# Patient Record
Sex: Female | Born: 1995 | Race: White | Hispanic: No | Marital: Married
Health system: Midwestern US, Academic
[De-identification: ages and names within clinical notes are randomized; demographics above are authoritative.]

## PROBLEM LIST (undated history)

## (undated) MED FILL — NIFEDIPINE 10 MG CAPSULE: 10 10 MG | ORAL | 30 days supply | Qty: 120 | Fill #0

---

## 2018-09-29 IMAGING — US US OB TRANSVAGINAL
1 series · 14 of 26 positions shown · non-contrast
Comparison: none

[Series 1: us ob transvaginal · 14 of 26 slices shown]
[im 1/26]
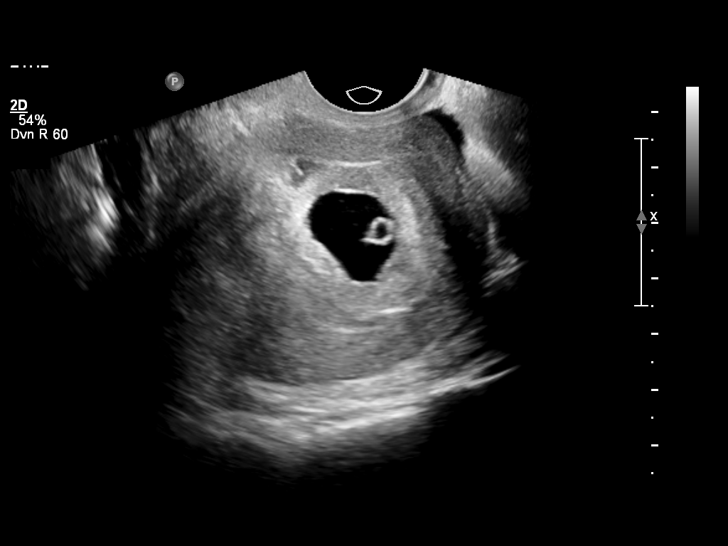
[im 3/26]
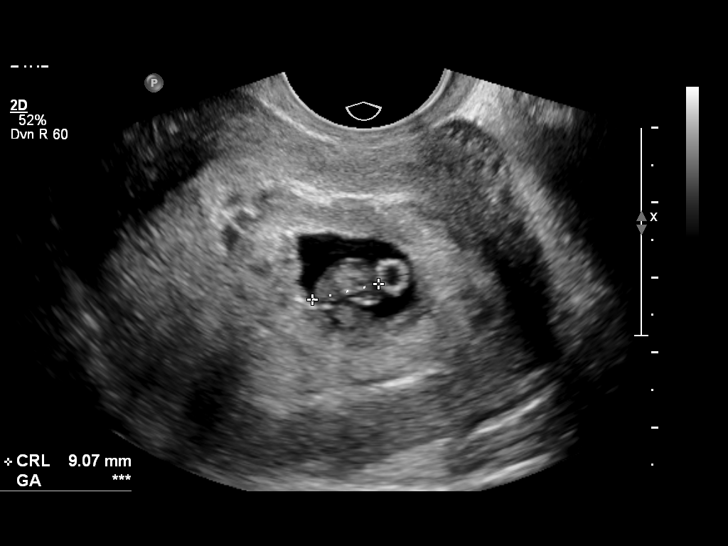
[im 5/26]
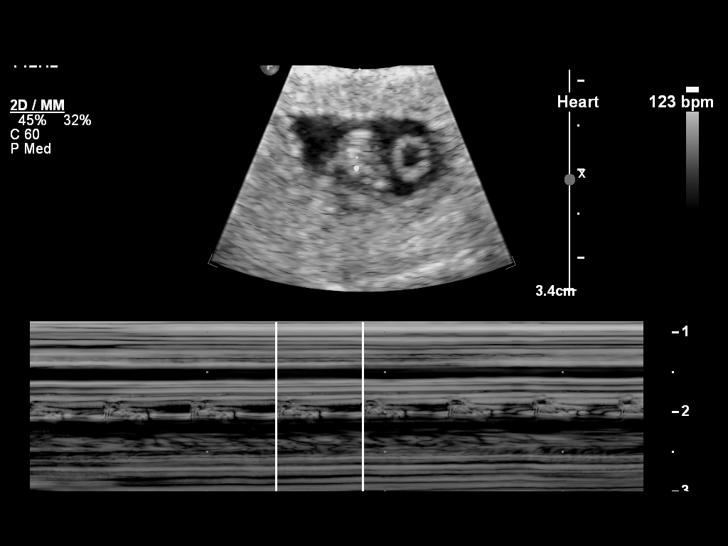
[im 7/26]
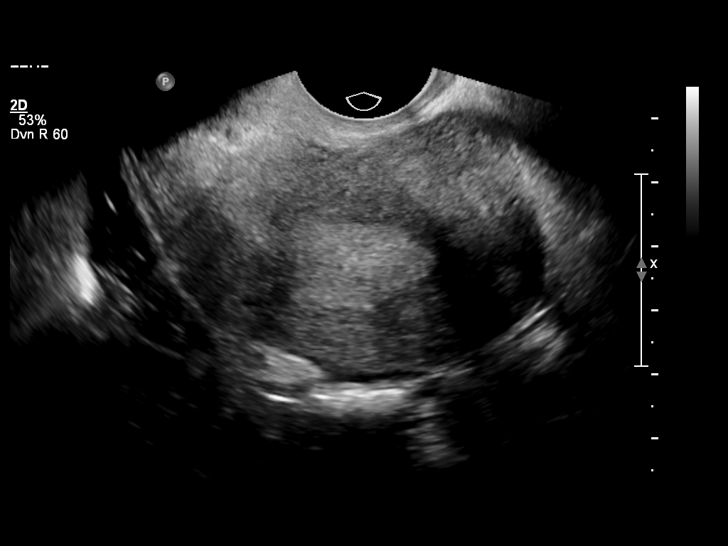
[im 9/26]
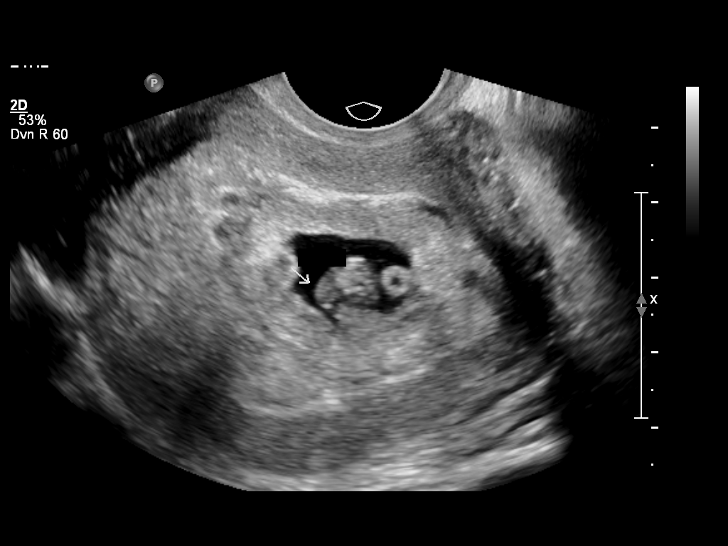
[im 11/26]
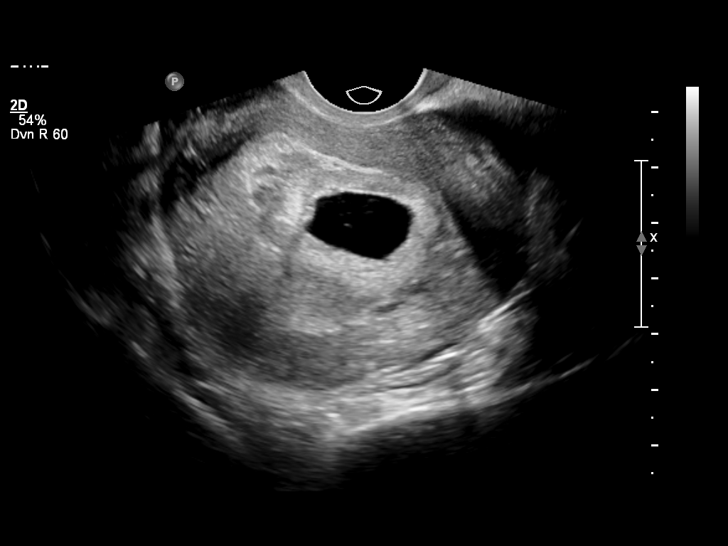
[im 13/26]
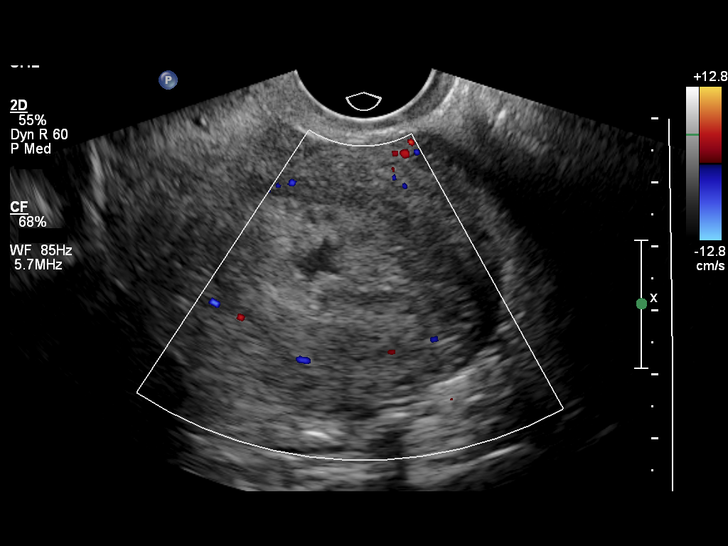
[im 14/26]
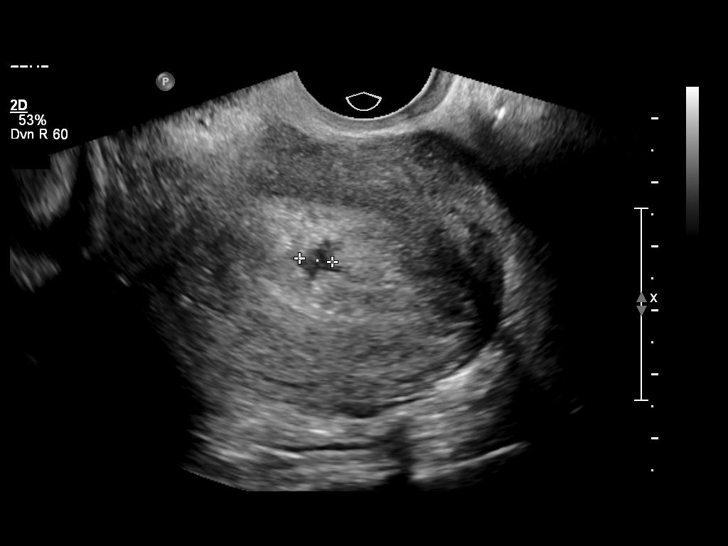
[im 16/26]
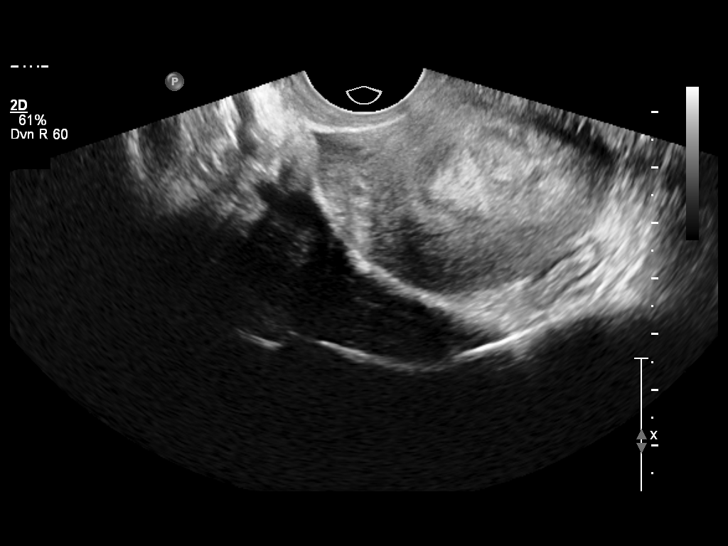
[im 18/26]
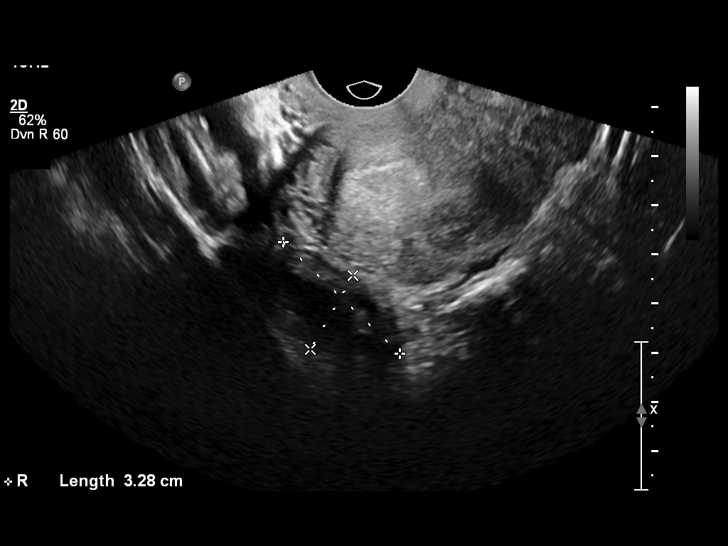
[im 20/26]
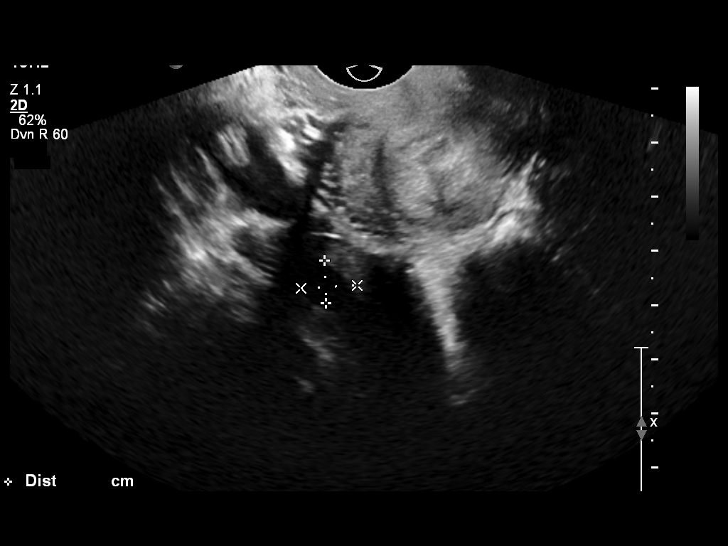
[im 22/26]
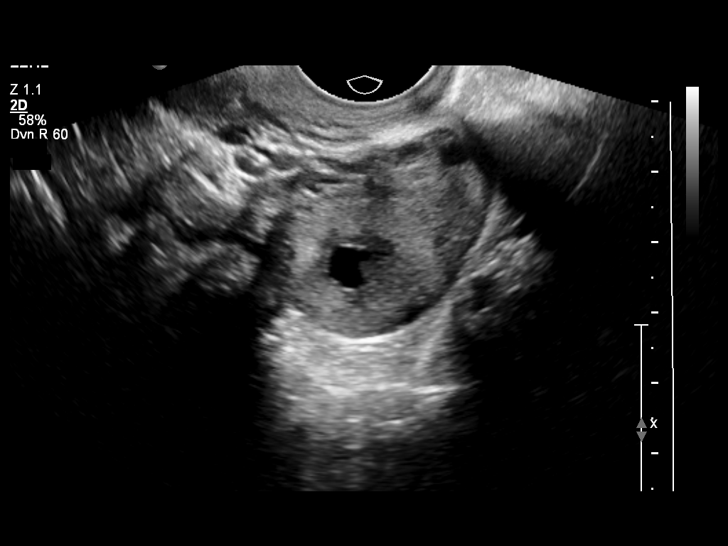
[im 24/26]
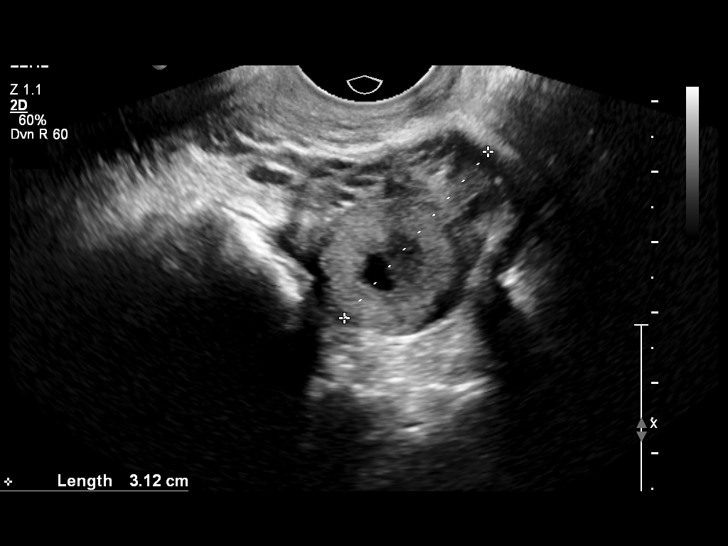
[im 26/26]
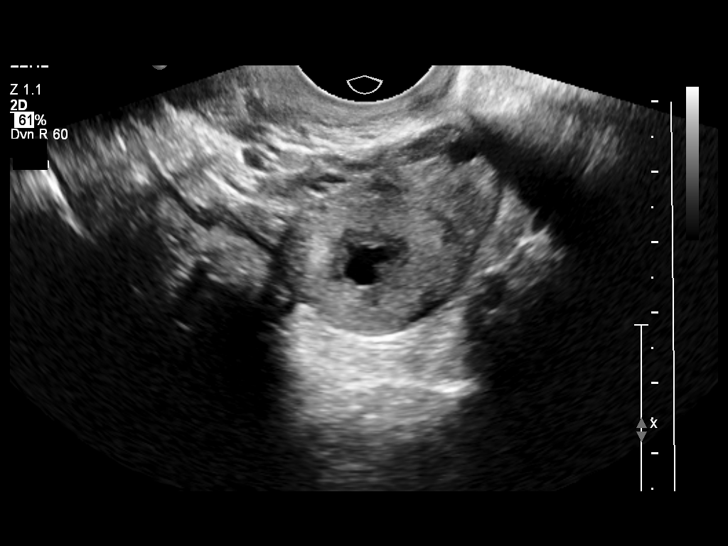

[14 of 26 positions shown; findings below may reference images not displayed]

This is a summary report. The complete report is available in the patient's medical record. If you cannot access the medical record, please contact the sending organization for a detailed fax or copy.
Single IUP measuring 7w0d, agreeable with dates
Heart rate detected
Yolk sac appears normal
Bilateral corpus luteums seen
Small amount of fluid seen in the pelvis
Small hypoechoic area seen adjacent to the gestational sac measures 0.5 cm, possible hemorrhage.
EDD by LMP is 05/15/19

## 2018-10-27 IMAGING — US US OB < 14 WEEKS SINGLE OR FIRST GESTATION
1 series · 14 of 27 positions shown · non-contrast
Comparison: none

[Series 1: us ob < 14 weeks single or first gestation · 14 of 27 slices shown]
[im 1/27]
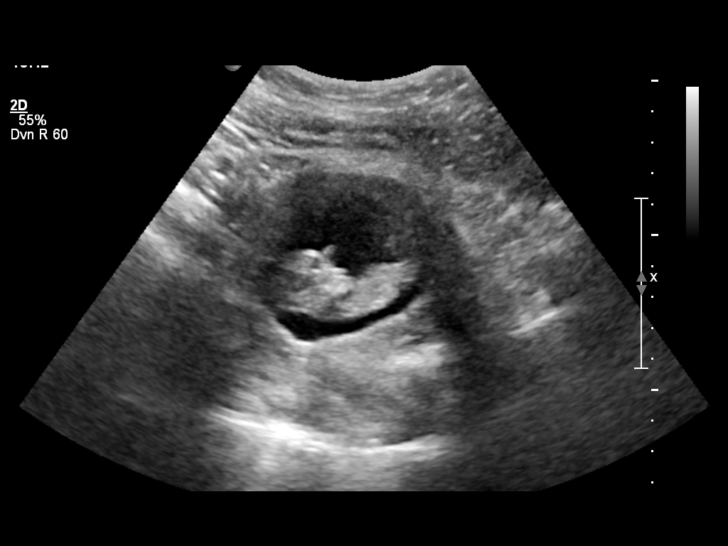
[im 3/27]
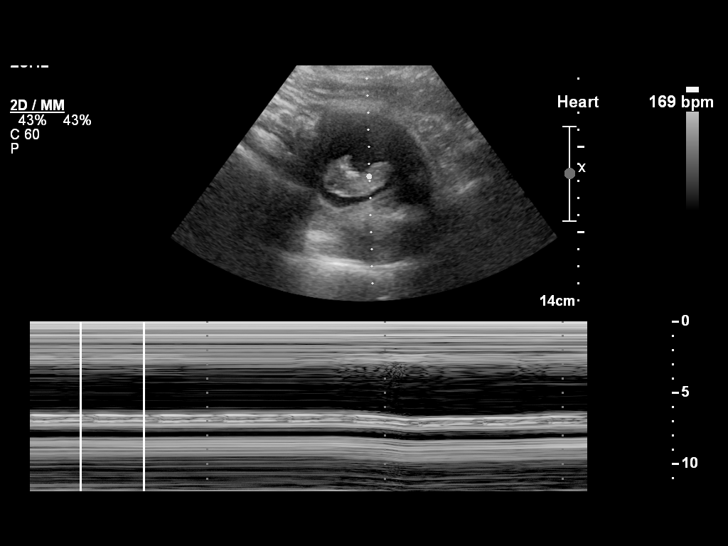
[im 5/27]
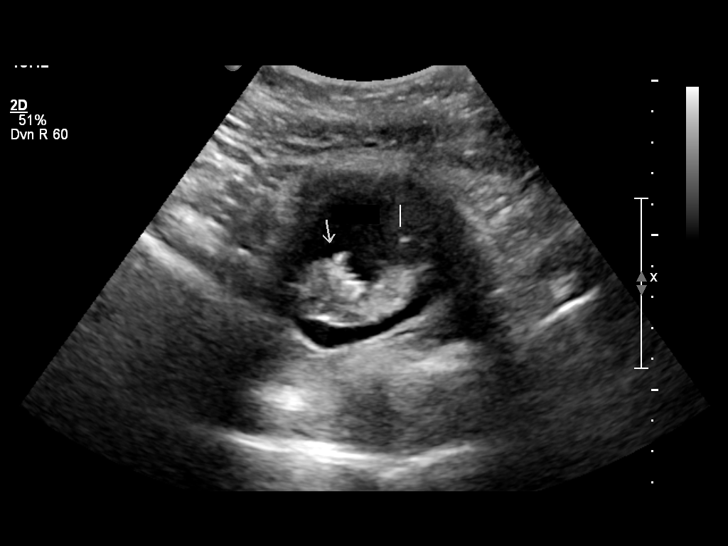
[im 7/27]
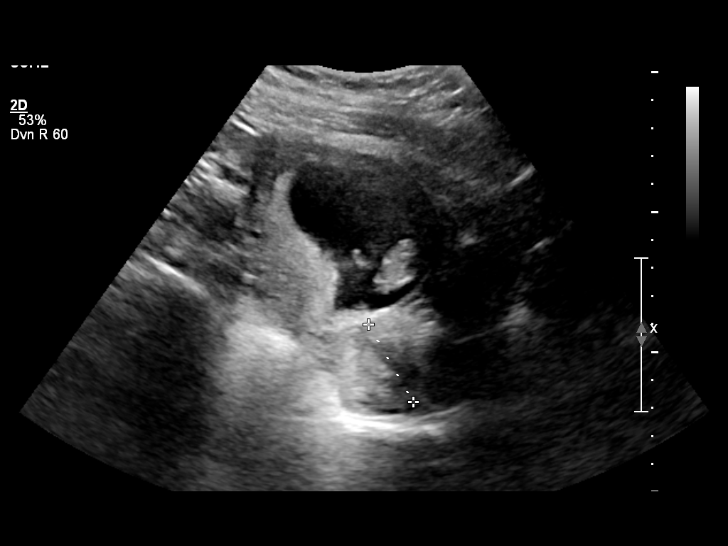
[im 9/27]
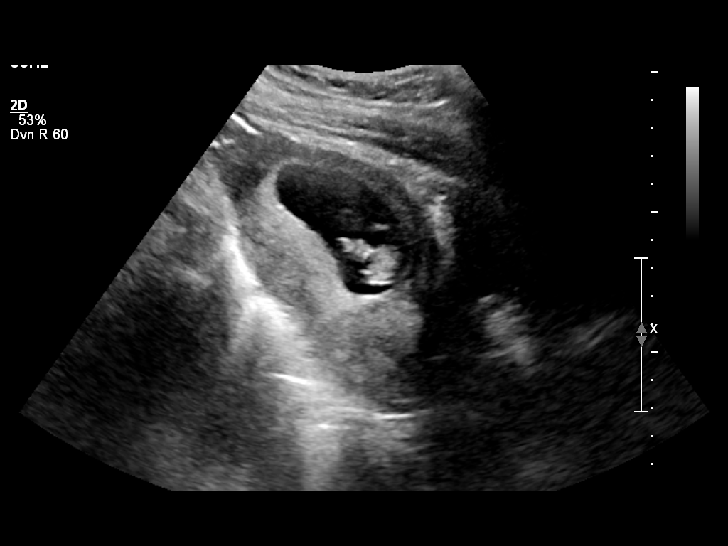
[im 11/27]
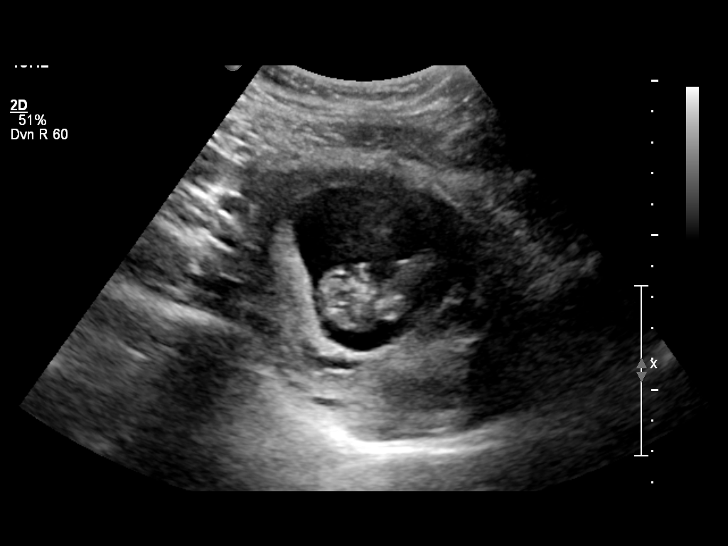
[im 13/27]
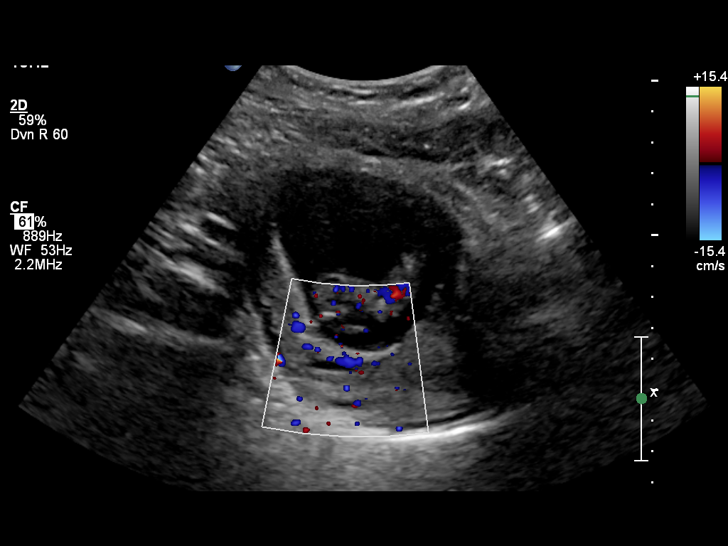
[im 15/27]
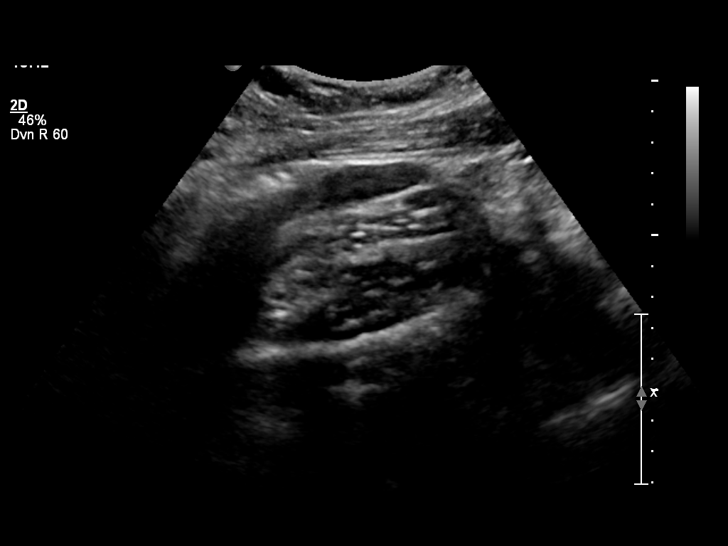
[im 17/27]
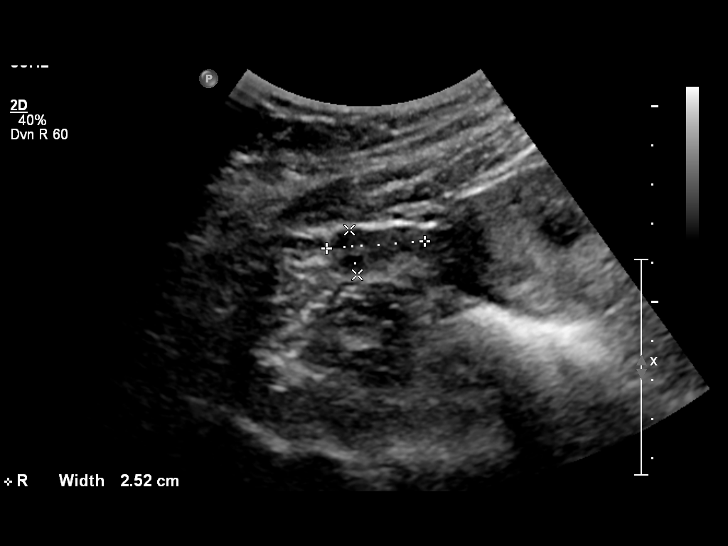
[im 19/27]
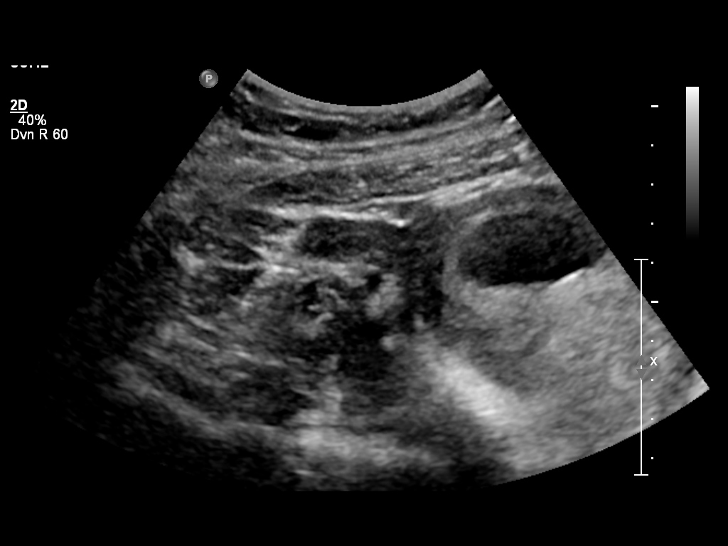
[im 21/27]
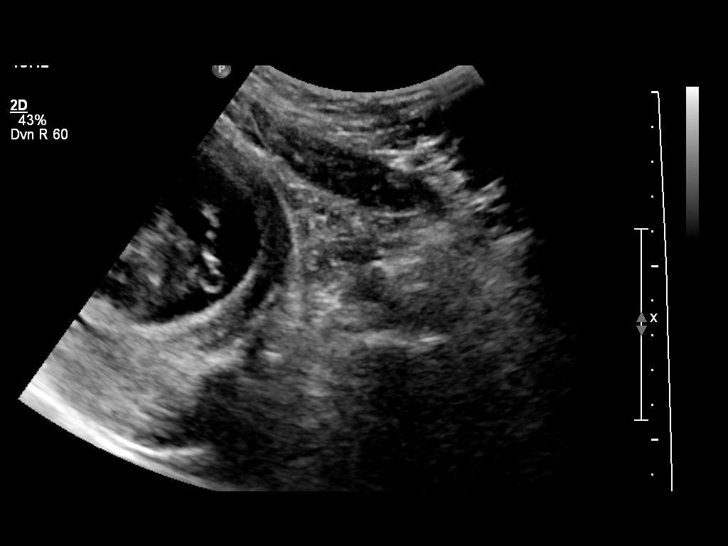
[im 23/27]
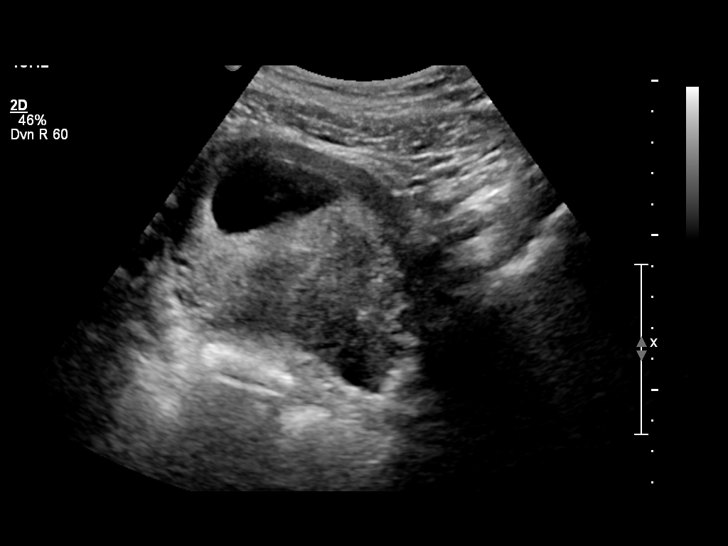
[im 25/27]
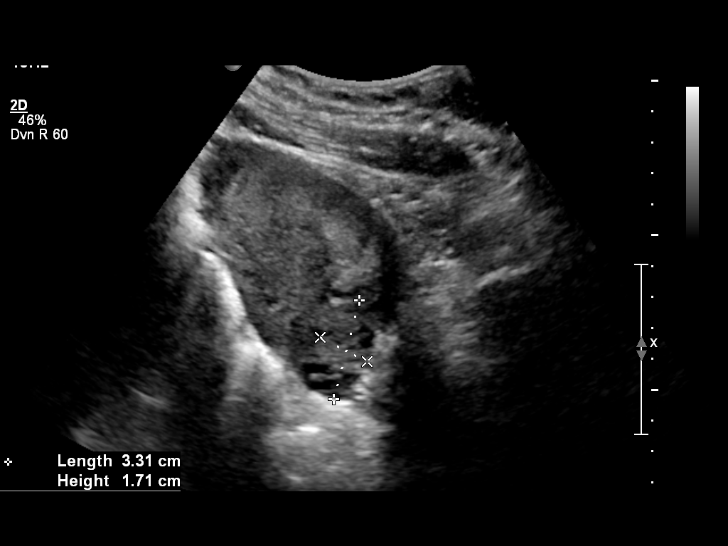
[im 27/27]
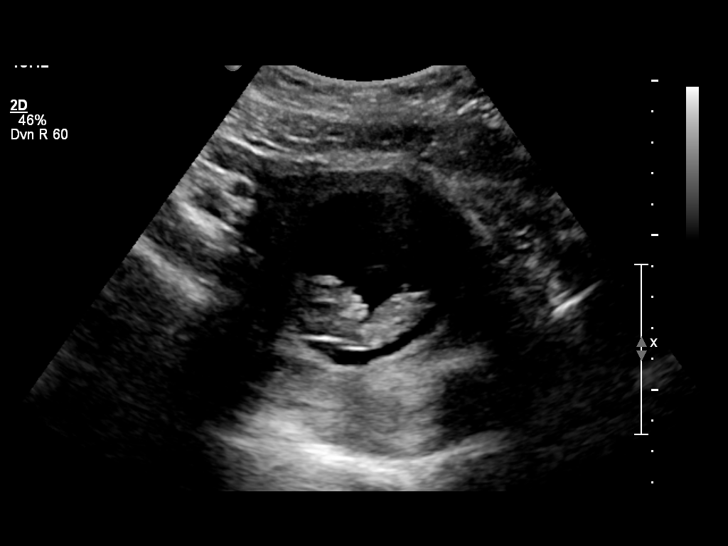

[14 of 27 positions shown; findings below may reference images not displayed]

This is a summary report. The complete report is available in the patient's medical record. If you cannot access the medical record, please contact the sending organization for a detailed fax or copy.
Single IUP measuring 66w8d, agreeable with dates
Heart rate detected
Yolk sac appears normal
Posterior placenta
Ovaries appear normal
No fluid seen in the pelvis
Hypoechoic area seen inferior appears to be a prominent blood vessel with Color flow- no sign of hemorrhage
EDD by LMP is 05/15/19

## 2019-08-24 IMAGING — CT CT ABDOMEN PELVIS W CONTRAST
2 of 4 series · 17 of 46 positions shown, 19 images · IV contrast (agent unspecified)
Comparison: No prior.

CT ABDOMEN AND PELVIS WITH CONTRAST
INDICATION: Bilateral lower abdominal pain

[Series 2: abdomen/pel with · axial · 0.70mm/px · z∈[-436,-9]mm · 14 of 191 slices shown, 16 images]
[im 10/191  soft-tissue]
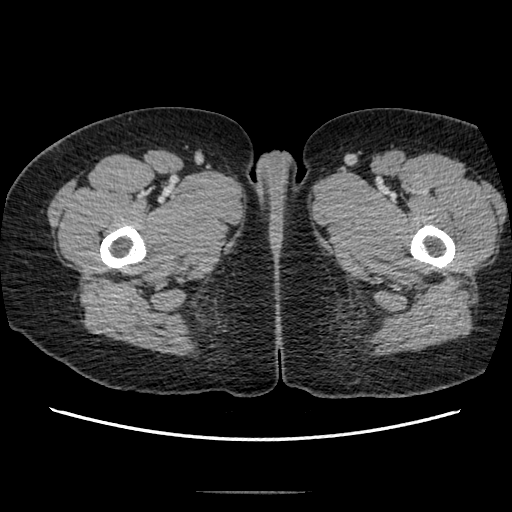
[im 10/191  bone]
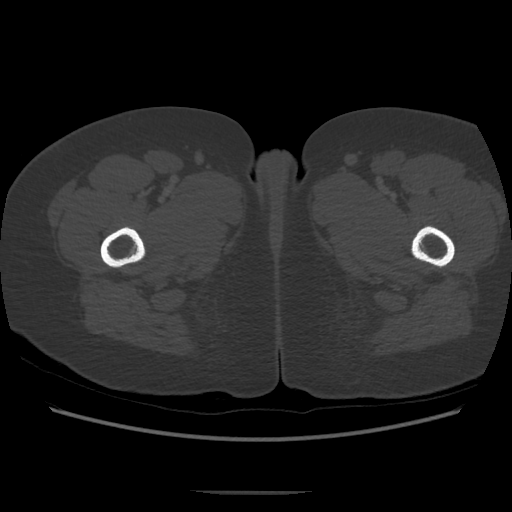
[im 28/191  soft-tissue]
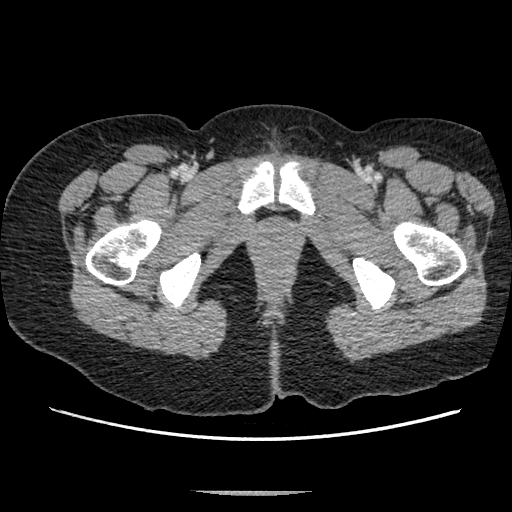
[im 37/191  soft-tissue]
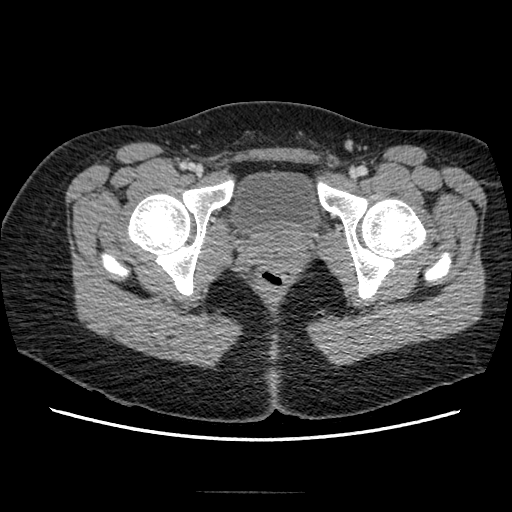
[im 55/191  soft-tissue]
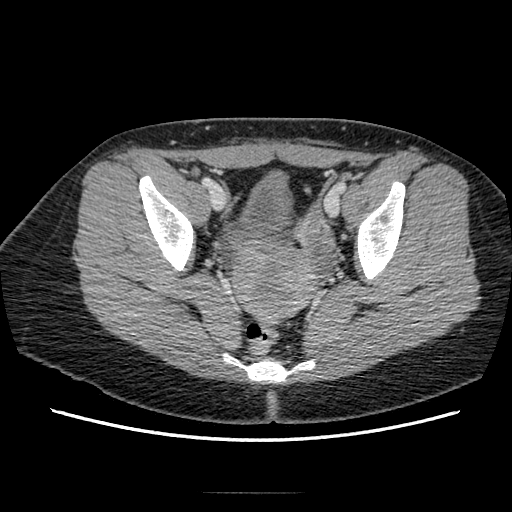
[im 64/191  soft-tissue]
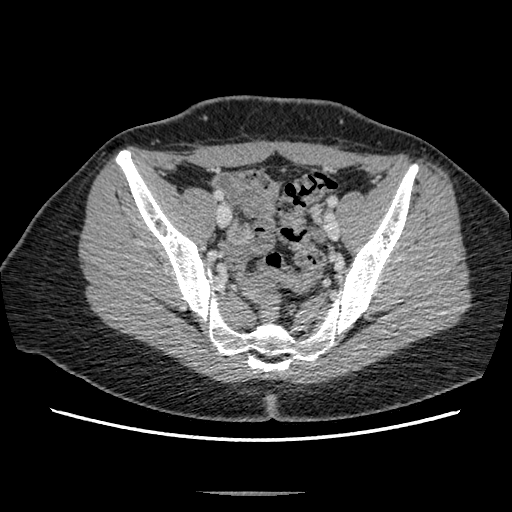
[im 73/191  soft-tissue]
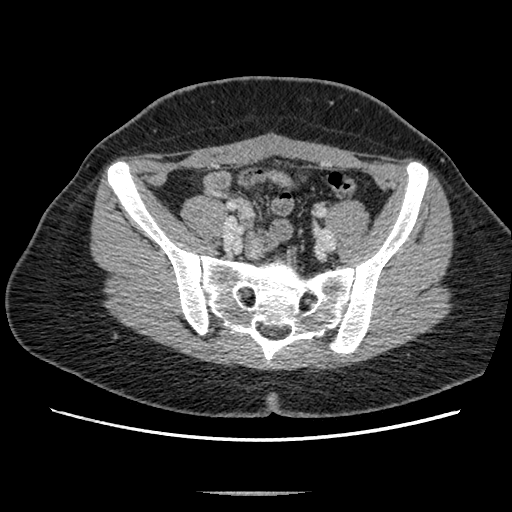
[im 91/191  soft-tissue]
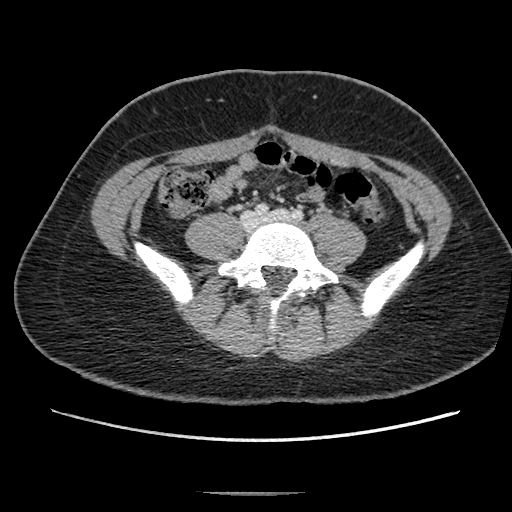
[im 100/191  soft-tissue]
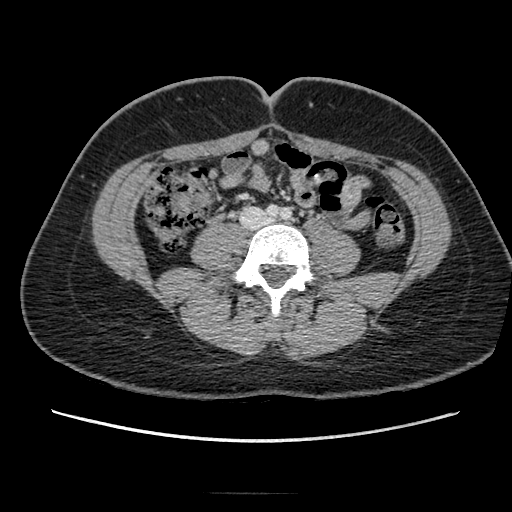
[im 118/191  soft-tissue]
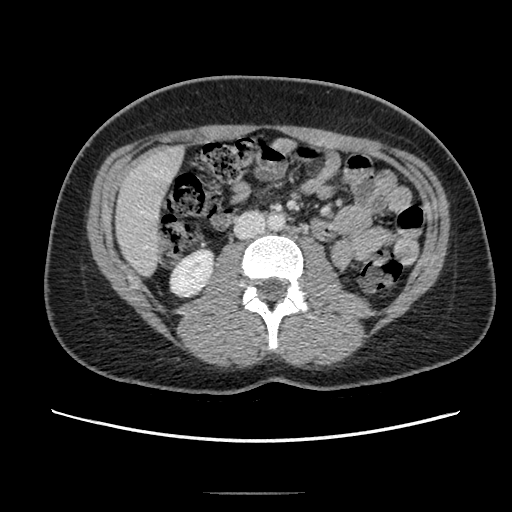
[im 118/191  bone]
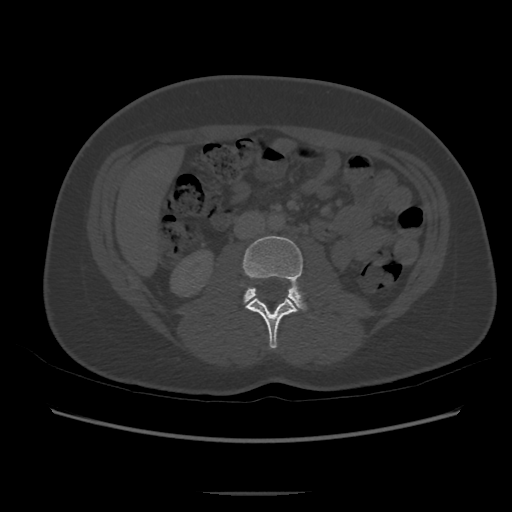
[im 127/191  soft-tissue]
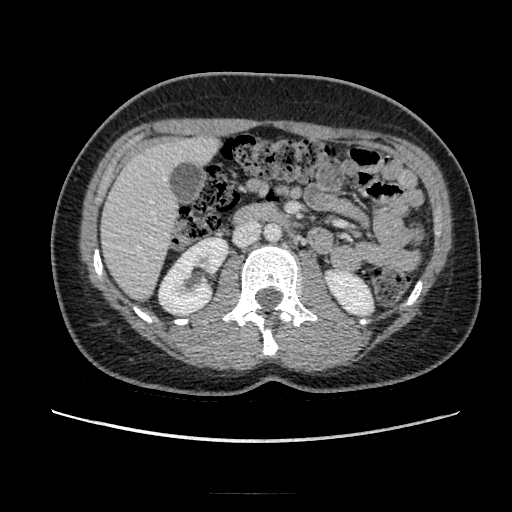
[im 145/191  soft-tissue]
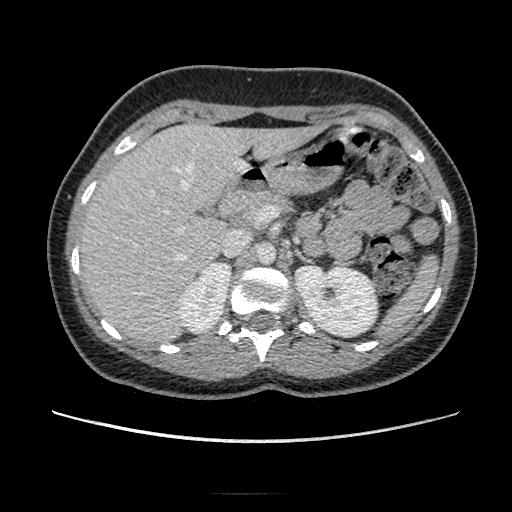
[im 154/191  soft-tissue]
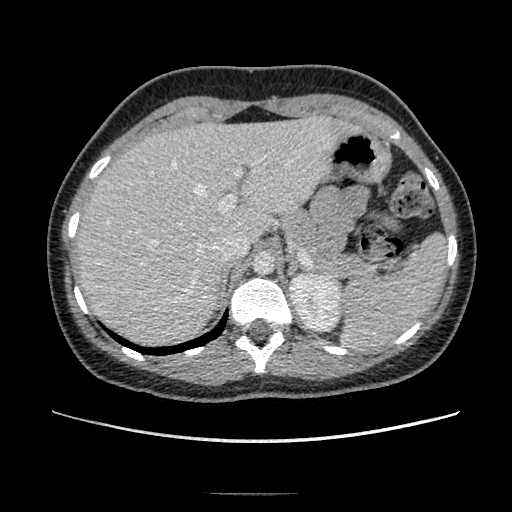
[im 163/191  soft-tissue]
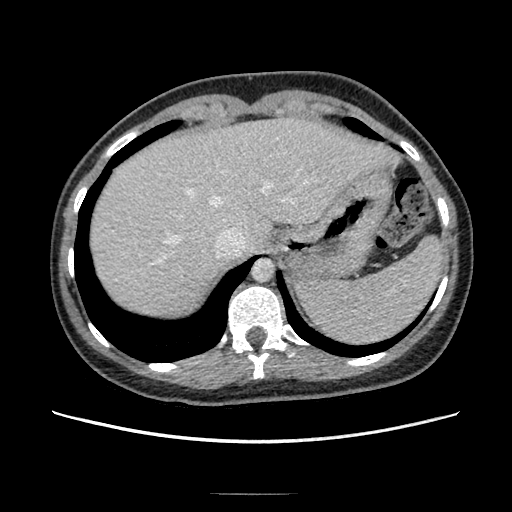
[im 181/191  soft-tissue]
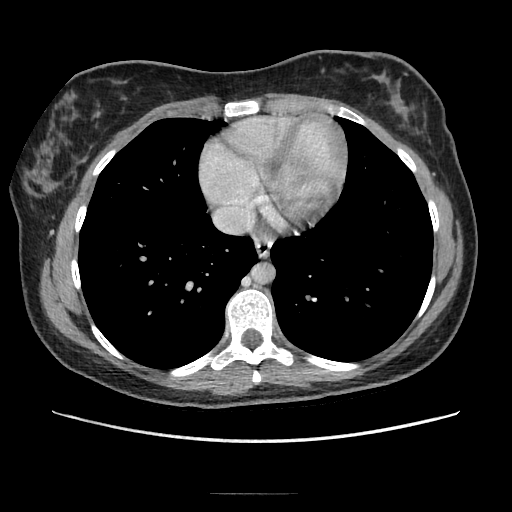

[Series 602: sag standard 2x2 · sagittal · 0.93mm/px · 3 of 181 slices shown]
[im 61/181  soft-tissue]
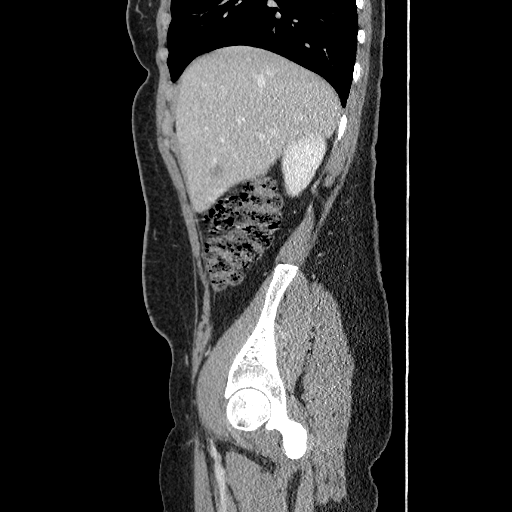
[im 81/181  soft-tissue]
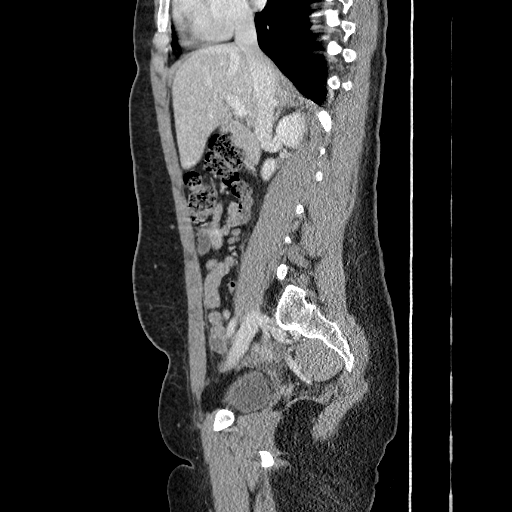
[im 101/181  soft-tissue]
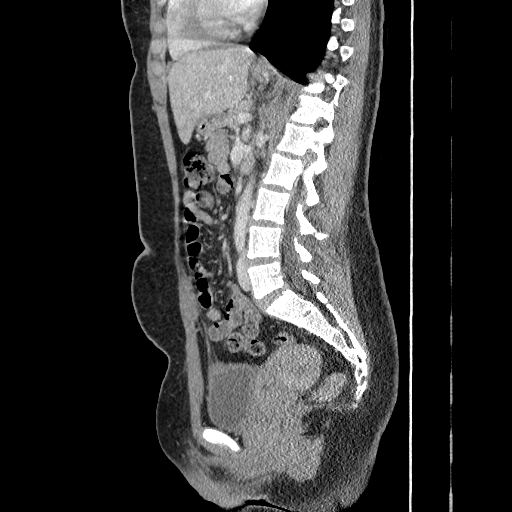

[17 of 46 positions shown; findings below may reference images not displayed]

PROCEDURE:
Informed consent for contrast was obtained. Postcontrast axial CT images were acquired. Coronal and sagittal reconstruction images were generated and reviewed. .
FINDINGS: No bowel obstruction, ascites or pneumoperitoneum. Normal caliber appendix. No pericolonic inflammatory change. Involuting 2.1 cm left corpus luteum cyst. The right adnexa, uterus and urinary bladder unremarkable.
The abdominal aorta, bilateral kidneys, bilateral adrenal glands, spleen, pancreas, liver, gallbladder, biliary tree and lung bases are unremarkable. No acute or suspicious osseous abnormalities.
IMPRESSION: No acute abnormality.
Location code: 1

## 2019-10-12 IMAGING — US US OB TRANSVAGINAL
1 series · 14 of 25 positions shown · non-contrast
Comparison: none

This is a summary report. The complete report is available in the patient's medical record. If you cannot access the medical record, please contact the sending organization for a detailed fax or copy.
INDICATION: Confirmation of pregnancy.
Twin intrauterine pregnancy that appears monochorionic/diamniotic.  Fetus A measures 9 weeks 1 days by CRL, EDD of 05/15/2020 by ultrasound today.  Cardiac activity visualized, FHR is 165 BPM.  Yolk sac appears within normal limits.   Fetus B measures 9 weeks 4 days by CRL, EDD of 05/12/2020 by ultrasound.  Cardiac activity visualized, FHR is 170 BPM.   Cervical length measures 3.4cm.  Right ovary measures 3.8 x 1.7 x 1.9cm.  Left ovary measures 1.9 x 4.3 x 2.6cm.     No free fluid seen within the pelvis.  Transvaginal and transabdominal scans performed.

[Series 1: us ob transvaginal · 14 of 25 slices shown]
[im 1/25]
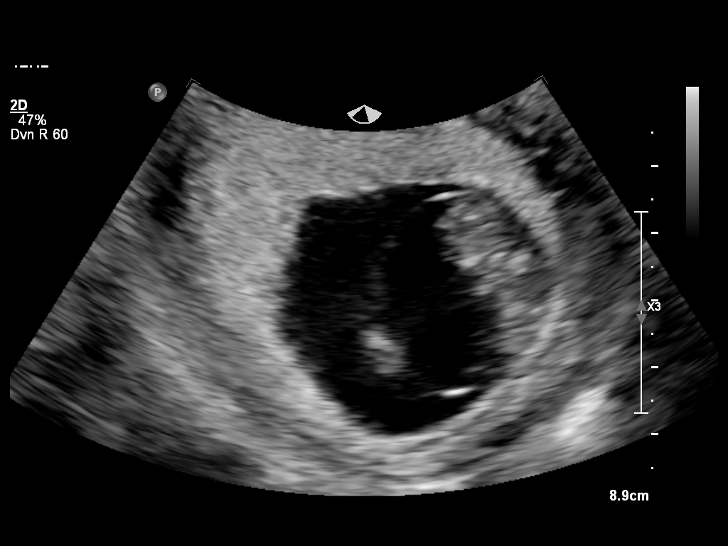
[im 3/25]
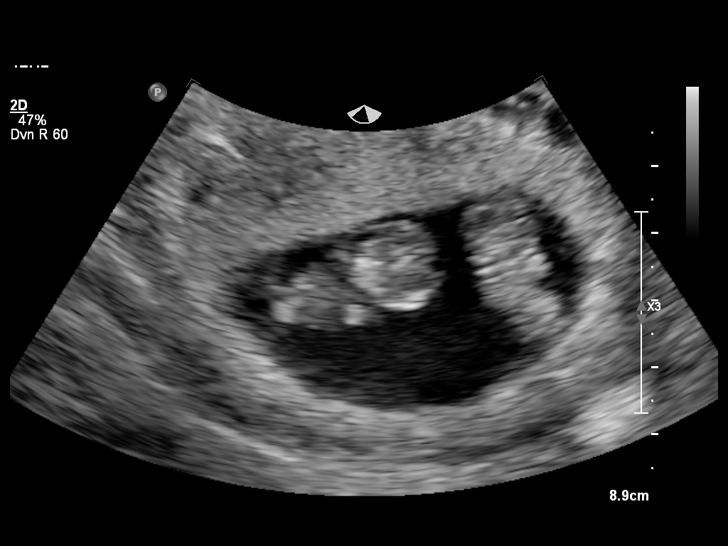
[im 5/25]
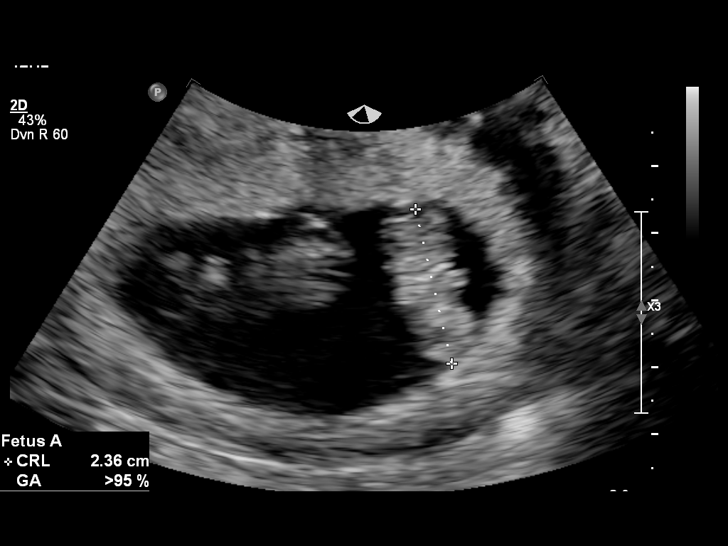
[im 7/25]
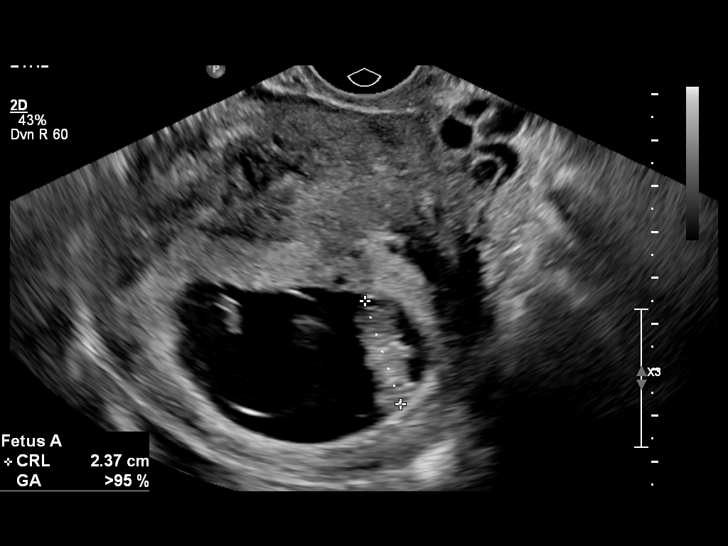
[im 9/25]
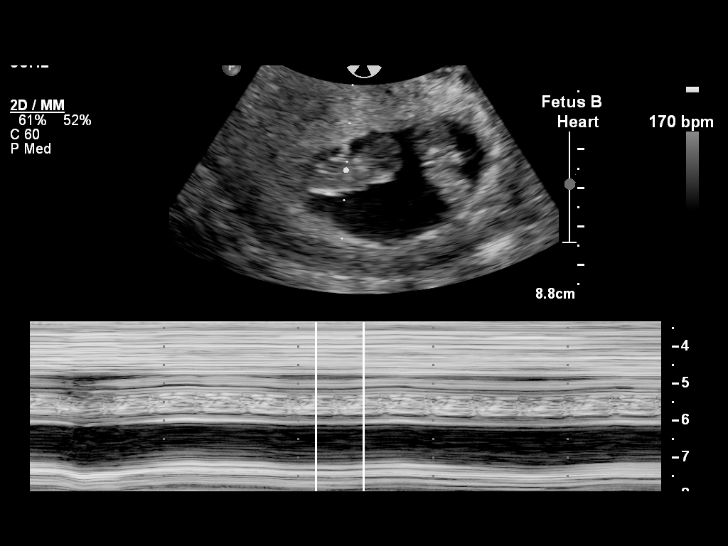
[im 10/25]
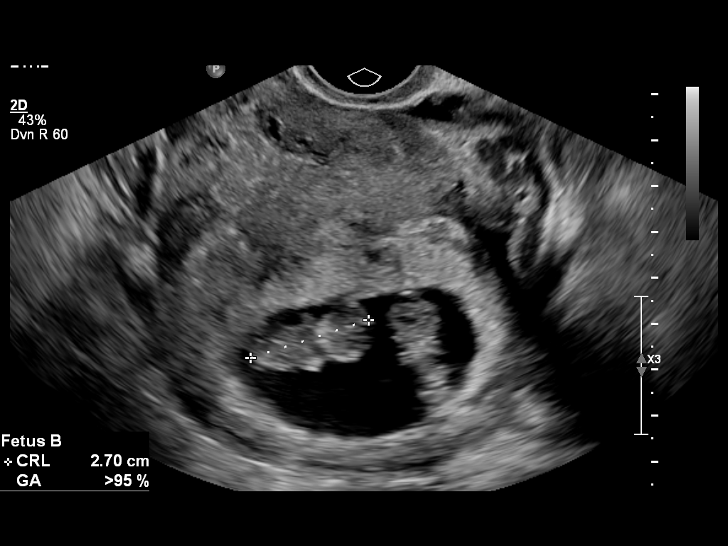
[im 12/25]
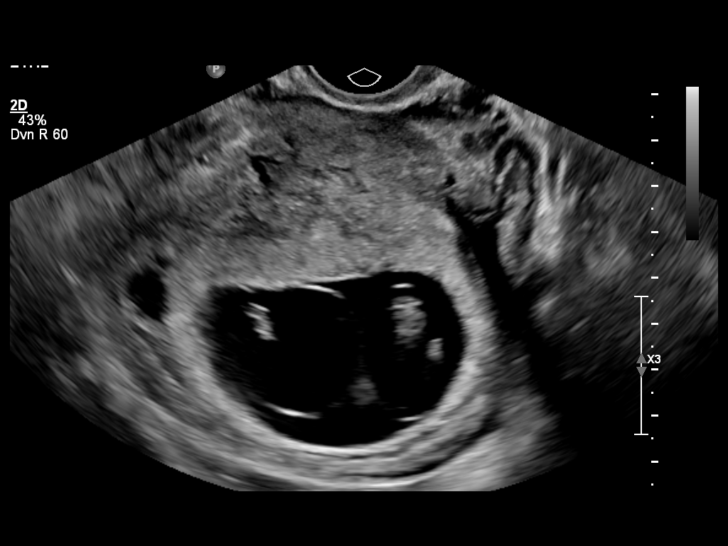
[im 14/25]
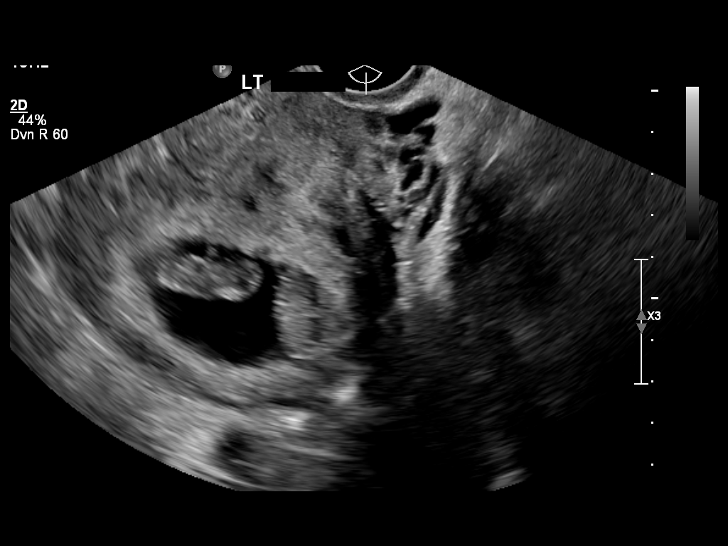
[im 16/25]
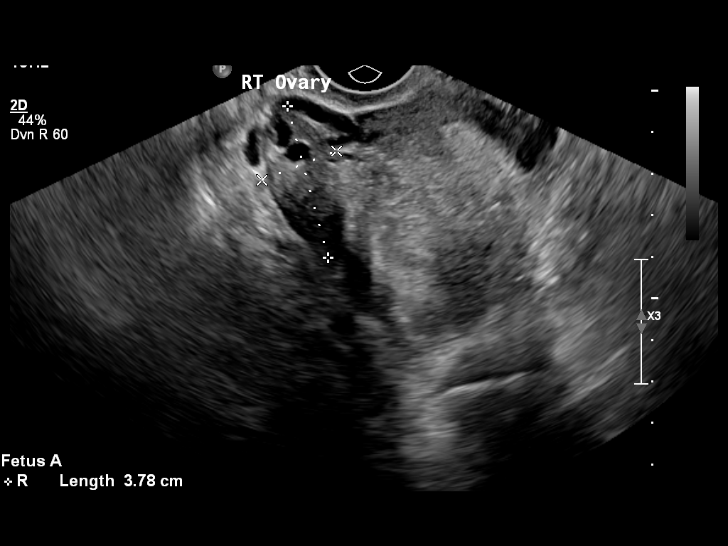
[im 17/25]
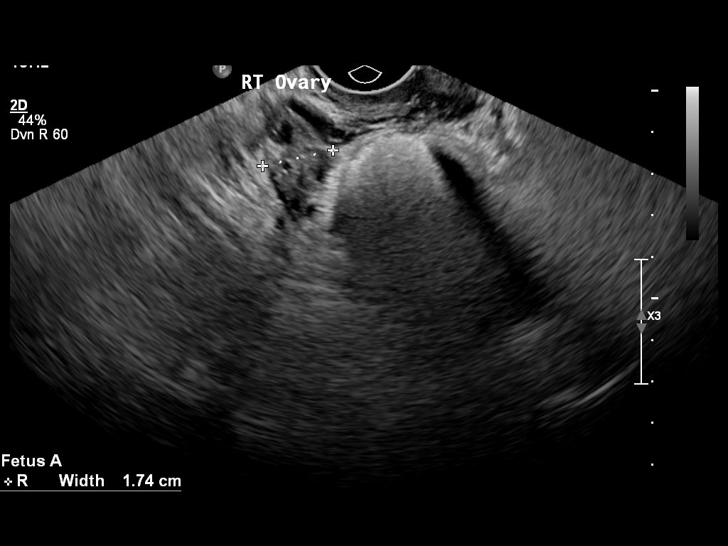
[im 19/25]
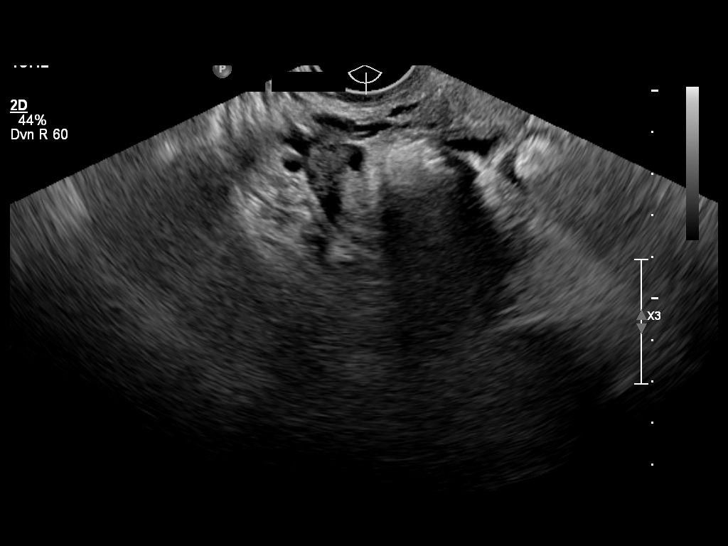
[im 21/25]
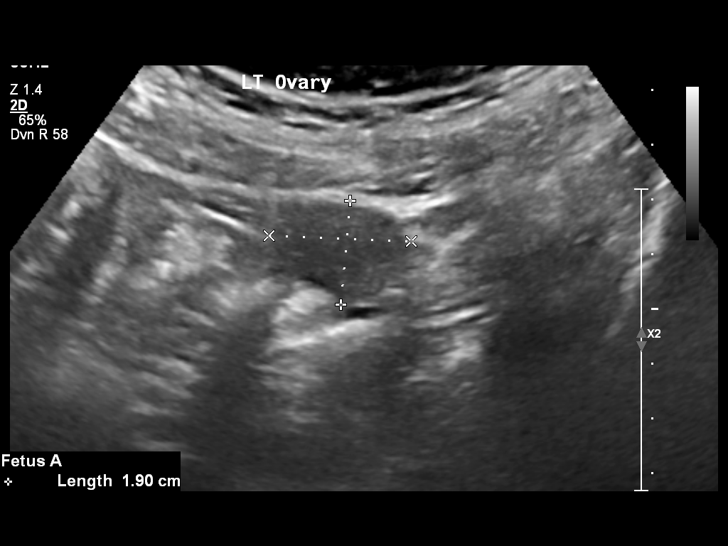
[im 23/25]
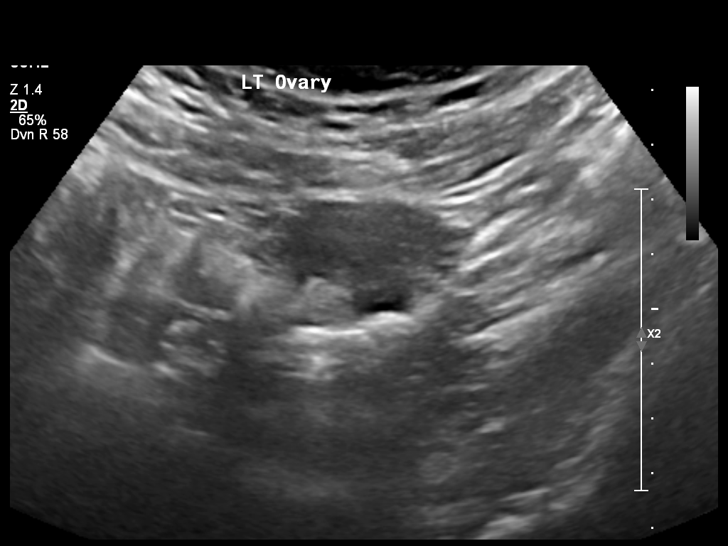
[im 25/25]
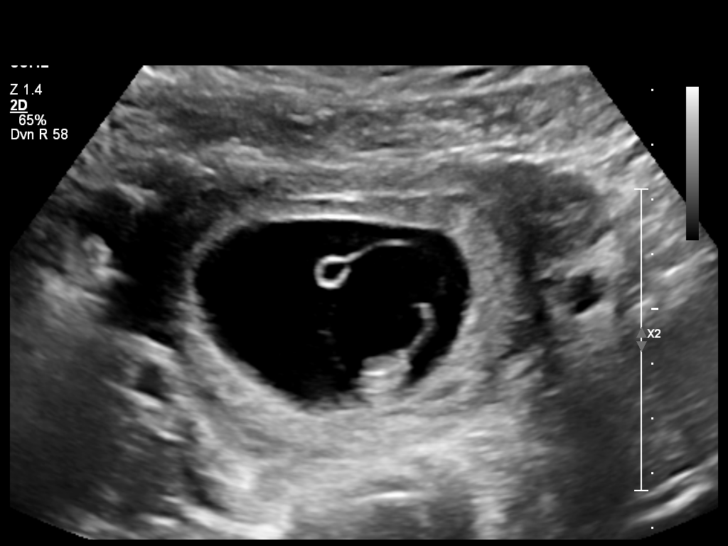

[14 of 25 positions shown; findings below may reference images not displayed]

## 2019-12-23 ENCOUNTER — Inpatient Hospital Stay

## 2019-12-23 ENCOUNTER — Observation Stay
Admission: EM | Admit: 2019-12-23 | Discharge: 2019-12-24 | Disposition: A | Discharge status: Home / Self Care | Payer: PRIVATE HEALTH INSURANCE

## 2019-12-23 DIAGNOSIS — O24012 Pre-existing diabetes mellitus, type 1, in pregnancy, second trimester: Principal | ICD-10-CM

## 2019-12-23 LAB — COMPREHENSIVE METABOLIC PANEL
ALT: 6 U/L (ref 7–52)
AST: 13 U/L (ref 13–39)
Albumin: 2.9 g/dL (ref 3.5–5.7)
Alkaline Phosphatase: 43 U/L (ref 36–125)
Anion Gap: 7 mmol/L (ref 3–16)
BUN: 7 mg/dL (ref 7–25)
CO2: 23 mmol/L (ref 21–33)
Calcium: 8.2 mg/dL (ref 8.6–10.3)
Chloride: 106 mmol/L (ref 98–110)
Creatinine: 0.61 mg/dL (ref 0.60–1.30)
Glucose: 200 mg/dL (ref 70–100)
Osmolality, Calculated: 286 mOsm/kg (ref 278–305)
Potassium: 3.4 mmol/L (ref 3.5–5.3)
Sodium: 136 mmol/L (ref 133–146)
Total Bilirubin: 0.6 mg/dL (ref 0.0–1.5)
Total Protein: 5.6 g/dL (ref 6.4–8.9)
eGFR AA CKD-EPI: >90 See note.
eGFR NONAA CKD-EPI: >90 See note.

## 2019-12-23 LAB — VENOUS BLOOD GAS, LINE/SYRINGE
%HBO2-Line Draw: 95.5 % — ABNORMAL HIGH (ref 40.0–70.0)
Base Excess-Line Draw: -0.6 mmol/L (ref <=3.0)
CO2 Content-Line Draw: 24 mmol/L — ABNORMAL LOW (ref 25–29)
Carboxyhgb-Line Draw: 1.2 %
HCO3-Line Draw: 23 mmol/L — ABNORMAL LOW (ref 24–28)
Methemoglobin-Line Draw: 0.6 % (ref 0.0–1.5)
PCO2-Line Draw: 34 mmHg — ABNORMAL LOW (ref 41–51)
PH-Line Draw: 7.44 — ABNORMAL HIGH (ref 7.32–7.42)
PO2-Line Draw: 82 mmHg — ABNORMAL HIGH (ref 25–40)
Reduced Hemoglobin-Line Draw: 2.7 % (ref 0.0–5.0)

## 2019-12-23 LAB — CBC
Hematocrit: 28.3 % (ref 35.0–45.0)
Hemoglobin: 9.8 g/dL (ref 11.7–15.5)
MCH: 30.4 pg (ref 27.0–33.0)
MCHC: 34.9 g/dL (ref 32.0–36.0)
MCV: 87.1 fL (ref 80.0–100.0)
MPV: 8.6 fL (ref 7.5–11.5)
Platelets: 243 10*3/uL (ref 140–400)
RBC: 3.24 10*6/uL (ref 3.80–5.10)
RDW: 13 % (ref 11.0–15.0)
WBC: 11.8 10*3/uL (ref 3.8–10.8)

## 2019-12-23 LAB — ANTIBODY SCREEN: Antibody Screen: NEGATIVE

## 2019-12-23 LAB — ABO/RH: Rh Type: POSITIVE

## 2019-12-23 LAB — TREPONEMA PALLIDUM AB WITH REFLEX: Treponema Pallidum: NEGATIVE

## 2019-12-23 LAB — POC GLU MONITORING DEVICE
POC Glucose Monitoring Device: 236 mg/dL (ref 70–100)
POC Glucose Monitoring Device: 180 mg/dL — ABNORMAL HIGH (ref 70–100)
POC Glucose Monitoring Device: 152 mg/dL (ref 70–100)
POC Glucose Monitoring Device: 222 mg/dL (ref 70–100)

## 2019-12-23 LAB — KETONES, BLOOD (BHB): Beta-Hydroxybuterate: 0.37 mmol/L — ABNORMAL HIGH (ref 0.02–0.27)

## 2019-12-23 LAB — BLOOD BANK SAMPLE - NO ORDERS

## 2019-12-23 MED ORDER — indomethacin (INDOCIN) capsule 25 mg
25 | Freq: Four times a day (QID) | ORAL | Status: AC
Start: 2019-12-23 — End: 2019-12-24
  Administered 2019-12-23 – 2019-12-24 (×4): 25 mg via ORAL

## 2019-12-23 MED ORDER — NIFEdipine (PROCARDIA) capsule 20 mg
10 | Freq: Four times a day (QID) | ORAL | Status: AC
Start: 2019-12-23 — End: 2019-12-23
  Administered 2019-12-23: 18:00:00 20 mg via ORAL

## 2019-12-23 MED ORDER — NIFEdipine (PROCARDIA) capsule 10 mg
10 | Freq: Four times a day (QID) | ORAL | Status: AC
Start: 2019-12-23 — End: 2019-12-24
  Administered 2019-12-24 (×3): 10 mg via ORAL

## 2019-12-23 MED ORDER — glucagon HCL 1 mg/mL injection 1 mg
1 | INTRAMUSCULAR | Status: AC | PRN
Start: 2019-12-23 — End: 2019-12-24

## 2019-12-23 MED ORDER — PNV,calcium 72-iron-folic acid (PRENATAL PLUS) 27 mg iron- 1 mg tablet Tab 1 tablet
27 | Freq: Every day | ORAL | Status: AC
Start: 2019-12-23 — End: 2019-12-24
  Administered 2019-12-23 – 2019-12-24 (×2): 1 via ORAL

## 2019-12-23 MED ORDER — NIFEdipine (PROCARDIA) 10 MG capsule
10 | ORAL | Status: AC
Start: 2019-12-23 — End: ?

## 2019-12-23 MED ORDER — potassium chloride (KLOR-CON M20) CR tablet 40 mEq
20 | Freq: Once | ORAL | Status: AC
Start: 2019-12-23 — End: 2019-12-23
  Administered 2019-12-23: 21:00:00 40 meq via ORAL

## 2019-12-23 MED ORDER — glucose chewable tablet 16 g
4 | ORAL | Status: AC | PRN
Start: 2019-12-23 — End: 2019-12-24

## 2019-12-23 MED ORDER — lactated Ringers 1,000 mL bolus
INTRAVENOUS | Status: AC | PRN
Start: 2019-12-23 — End: 2019-12-24

## 2019-12-23 MED ORDER — insulin aspart (NovoLOG) injection for INSULIN PUMP 300 Units
100 | SUBCUTANEOUS | Status: AC
Start: 2019-12-23 — End: 2019-12-24
  Administered 2019-12-23: 22:00:00 300 [IU]

## 2019-12-23 MED ORDER — lactated Ringers 500 mL IV fluid
Freq: Once | INTRAVENOUS | Status: AC
Start: 2019-12-23 — End: 2019-12-23
  Administered 2019-12-23: 21:00:00 via INTRAVENOUS

## 2019-12-23 MED FILL — NOVOLOG U-100 INSULIN ASPART 100 UNIT/ML SUBCUTANEOUS SOLUTION: 100 100 unit/mL | SUBCUTANEOUS | Qty: 10

## 2019-12-23 MED FILL — PRENATAL PLUS (CALCIUM CARBONATE) 27 MG IRON-1 MG TABLET: 27 27 mg iron- 1 mg | ORAL | Qty: 1

## 2019-12-23 MED FILL — NIFEDIPINE 10 MG CAPSULE: 10 10 MG | ORAL | Qty: 2

## 2019-12-23 MED FILL — INDOMETHACIN 25 MG CAPSULE: 25 25 MG | ORAL | Qty: 1

## 2019-12-23 MED FILL — KLOR-CON M20 MEQ TABLET,EXTENDED RELEASE: 20 20 mEq | ORAL | Qty: 2

## 2019-12-23 NOTE — Unmapped (Signed)
To bedside. Patient with mild cramping after foley catheter removed. Overall feels well.     Basal rates on pump increased by 10%.       Cleda Daub, DO   OB/GYN PGY-3

## 2019-12-23 NOTE — Unmapped (Signed)
4 extra units of insulin admin per pt self insulin pump per MD for increased BG.

## 2019-12-23 NOTE — Unmapped (Signed)
Pt given bedtime snack of a peanut butter and jelly sandwich.

## 2019-12-23 NOTE — Unmapped (Signed)
Pt to be admitted to antepartum, [facility] charge nurse notified.

## 2019-12-23 NOTE — Unmapped (Signed)
RN assumed care of patient admitted to room 3431, whiteboard updated.Vital signs stable and denies LOF, VB. Reports irregular ctx, pt placed on cont. TOCO per MD. Pt attached self OB insulin pump. Pt states she bolused 11.3 units with meal family brought to bedside. RN educated pt about consistent carb diet that she was placed on after admission to [facility] and x7 fingersticks. Will obtain postprandial BG. Fluid bolus started per [facility].  All needs met at this time. Call light within reach.

## 2019-12-23 NOTE — Unmapped (Signed)
[facility] ED OB  Provider Note      Patient Name: Rebecca Brady  MRN: [id number]  Date of Birth: 01-26-1995  Date of Service: 12/23/2019    Antepartum Care: Fetal Care    Subjective:  Rebecca Brady is a 24 y.o. G2P0101 at [redacted]w[redacted]d by 9 week ultrasound who presents for rule out DKA. Patient presents s/p SFLP with amnioreduction earlier today done at [facility] (1500 off). Per report, patient has received IV Tylenol (just finished), indocin 50PO at 1130, and 1L IVF in the OR. Blood glucose got as high as 217 while in the OR. Patient is on an insulin pump and at arrival is back to her basal rate of 1.2. She was having contractions after the procedure, 2-62min apart, that were resolved with the indocin.     She is due for her 20mg  procardia that was started q6 yesterday. Patient was reportedly tachycardic in OR, thought to be 2/2 procardia.    She denies LOF, vaginal bleeding, contractions. She reports normal fetal movement. Endorses mild left sided abdominal pain that has been present and unchanged since the procedure.     Denies headache, change in vision, RUQ pain.     Antepartum course complicated by:  1. Type 1 Diabetes  2. Twin twin transfusion  3. Anxiety/Depression  4. History of PreE SF     OB History   Gravida Para Term Preterm AB Living   2 1   1   1    SAB IAB Ectopic Multiple Live Births           1      # Outcome Date GA Lbr Len/2nd Weight Sex Delivery Anes PTL Lv   2 Current            1 Preterm 04/18/19    F CS-LTranv Spinal N LIV      Complications: Pre-eclampsia       History:  Past Medical History: History reviewed. No pertinent past medical history.  Past Surgical History: History reviewed. No pertinent surgical history.    Social History:   Social History     Socioeconomic History   ??? Marital status: Married     Spouse name: Not on file   ??? Number of children: Not on file   ??? Years of education: Not on file   ??? Highest education level: Not on file   Occupational History   ??? Not on file   Tobacco Use   ??? Smoking  status: Never Smoker   ??? Smokeless tobacco: Never Used   Substance and Sexual Activity   ??? Alcohol use: Not Currently   ??? Drug use: Not on file   ??? Sexual activity: Yes     Partners: Male     Birth control/protection: None   Other Topics Concern   ??? Not on file   Social History Narrative   ??? Not on file     Social Determinants of Health     Financial Resource Strain: Not on file   Physical Activity: Not on file   Stress: Not on file   Social Connections: Not on file       Family History: No family history on file.    Medications:  No current facility-administered medications on file prior to encounter.     No current outpatient medications on file prior to encounter.       Allergies:  No Known Allergies    Objective:    Vital signs in last 24 hours:  Vitals:    12/23/19 1244 12/23/19 1319   BP: 117/58 130/58   Pulse: 85    Resp: 16    Temp: 98.3 ??F (36.8 ??C)    SpO2: 95%        General: well nourished, well developed, in no apparent distress   HEENT: normocephalic, atraumatic, moist mucous membranes   Pulmonary: unlabored respirations  Cardiac: Regular rate  Abdomen: Soft, non-tender, gravid uterus; one port incision on RLQ c/d/i with dermabond.   Skin: warm and well perfused, no edema  Neuro: alert and oriented x3  Psych: appropiate mood and affect     Electronic Fetal Monitoring:     Lab Review:  Blood type: O positive  GBS: unk  HIV: NR (10/12/19)  RPR: NR (10/12/19)  GC/CT: neg (10/12/19)  Rubella: Immune (10/12/19)  GCT: unk  HepBsAg: Negative (10/12/19)  Hep C Ab: Negative (10/12/19)    Ultrasound:   12/15 (19+3) Twin A (donor): 218g, AC 9%, cephalic, maternal left, anterior, anhydramnios, bladder not identified, echogenic bowel, Twin B (recipient): 350g, AC 89%, variable, anterior, DVP 14.1cm, bilateral renal pelviectasis, FW discordancy 38%     Impression/Plan: Rebecca Brady is a 24 y.o. G2P0101 at [redacted]w[redacted]d by 9 week ultrasound presenting s/p SLP for rule out DKA given intraoperative hyperglycemia.     Hyperglycemia  without DKA  - 236 upon arrival, patient dosed 4U and 1hr recheck is 180 -> 156  - Of note, glucometer here is reading 180, the patient's sensor was reading 134  - Clinically and hemodynamically stable, VSS, Rh+  - Irregular contractions on toco (4-5 in 1hr)  - Exam without evidence of tachypnea, abdominal pain, headache  - Labs sent significant for anion gap 7, ph 7.44, K 3.4, BHB 0.37  - Given need for improvement in BG control and recent surgical procedure requiring 24hr post-procedure observation, plan to admit for monitoring of blood sugars    Preeclampsia  - Has history of PreE SF in last pregnancy at 36wk  - Started on procardia 20q6 yesterday; some tachycardia in OR  - Given dose upon arrival    Twin-twin transfusion  - Ultrasound was completed on 12/20/19 at Dr. Cecile Brady. Rebecca Brady's office at [redacted]w[redacted]d weeks estimated gestational age. Twin A had a DVP of 0 cm, dopplers not reported. Twin B had a DVP of 10.5 cm, dopplers not reported, and bladder seen. Weight discordance at 28.2%.     History of PreE SF  - Delivered 04/18/19 at 36wk by C section    Fetal Status   Baby A 163bpm  Baby B 167bpm    GBS unk    COVID   - negative yesterday at [facility]    Prenatal Care  - Rh positive  - Rubella immune  - Rebecca Brady NR  - BCM: undecided    Disposition:   - admit to ante for 24hr    Discussed with: Dr. Ilsa Iha (Attending)    Gwendolyn Lima Royal Piedra MD PhD PGY-2     Sharren Bridge, MD  Resident  12/23/19 [phone]

## 2019-12-23 NOTE — Unmapped (Signed)
L&D Upper Level Resident   Admission Note     Rebecca Brady is a 24 y.o. G2P0101 at [redacted]w[redacted]d by  9 week ultrasound presenting s/p SLP for monitoring and rule out DKA given intraoperative hyperglycemia.     History reviewed. No pertinent past medical history.  History reviewed. No pertinent surgical history.  OB History   Gravida Para Term Preterm AB Living   2 1   1   1    SAB IAB Ectopic Multiple Live Births           1      # Outcome Date GA Lbr Len/2nd Weight Sex Delivery Anes PTL Lv   2 Current            1 Preterm 04/18/19    F CS-LTranv Spinal N LIV      Complications: Pre-eclampsia       No current facility-administered medications on file prior to encounter.     No current outpatient medications on file prior to encounter.     No Known Allergies    Objective:   Patient Vitals for the past 24 hrs:   BP Temp Pulse Resp SpO2   12/23/19 1319 130/58 -- -- -- --   12/23/19 1244 117/58 98.3 ??F (36.8 ??C) 85 16 95 %       Gen: NAD  Resp: non-labored  Abd: gravid, non tender  Ext: no peripheral edema    Toco: rare decels  A: 163   B: 167    Recent Results (from the past 24 hour(s))   POC Glucose Monitoring Device    Collection Time: 12/23/19 12:38 PM   Result Value Ref Range    POC Glucose Monitoring Device 236 (H) 70 - 100 mg/dL   Venous Blood Gas, Line/Syringe    Collection Time: 12/23/19  1:00 PM   Result Value Ref Range    PH-Line Draw 7.44 (H) 7.32 - 7.42    PCO2-Line Draw 34 (L) 41 - 51 mm Hg    PO2-Line Draw 82 (H) 25 - 40 mm Hg    HCO3-Line Draw 23 (L) 24 - 28 mmol/L    CO2 Content-Line Draw 24 (L) 25 - 29 mmol/L    Base Excess-Line Draw -0.6 -2.0 - 3.0 mmol/L    %HBO2-Line Draw 95.5 (H) 40.0 - 70.0 %    Carboxyhgb-Line Draw 1.2 %    Methemoglobin-Line Draw 0.6 0.0 - 1.5 %    Reduced Hemoglobin-Line Draw 2.7 0.0 - 5.0 %   Ketones, Blood (BHB)    Collection Time: 12/23/19  1:01 PM   Result Value Ref Range    Beta-Hydroxybuterate 0.37 (H) 0.02 - 0.27 mmol/L   Comprehensive Metabolic Panel    Collection Time:  12/23/19  1:01 PM   Result Value Ref Range    Sodium 136 133 - 146 mmol/L    Potassium 3.4 (L) 3.5 - 5.3 mmol/L    Chloride 106 98 - 110 mmol/L    CO2 23 21 - 33 mmol/L    Anion Gap 7 3 - 16 mmol/L    BUN 7 7 - 25 mg/dL    Creatinine 4.09 8.11 - 1.30 mg/dL    Glucose 914 (H) 70 - 100 mg/dL    Calcium 8.2 (L) 8.6 - 10.3 mg/dL    Total Bilirubin 0.6 0.0 - 1.5 mg/dL    AST 13 13 - 39 U/L    ALT 6 (L) 7 - 52 U/L    Alkaline Phosphatase 43 36 -  125 U/L    Total Protein 5.6 (L) 6.4 - 8.9 g/dL    Albumin 2.9 (L) 3.5 - 5.7 g/dL    Osmolality, Calculated 286 278 - 305 mOsm/kg    eGFR AA CKD-EPI >90 See note.    eGFR NONAA CKD-EPI >90 See note.   POC Glucose Monitoring Device    Collection Time: 12/23/19  1:38 PM   Result Value Ref Range    POC Glucose Monitoring Device 180 (H) 70 - 100 mg/dL   CBC    Collection Time: 12/23/19  2:12 PM   Result Value Ref Range    WBC 11.8 (H) 3.8 - 10.8 10E3/uL    RBC 3.24 (L) 3.80 - 5.10 10E6/uL    Hemoglobin 9.8 (L) 11.7 - 15.5 g/dL    Hematocrit 25.3 (L) 35.0 - 45.0 %    MCV 87.1 80.0 - 100.0 fL    MCH 30.4 27.0 - 33.0 pg    MCHC 34.9 32.0 - 36.0 g/dL    RDW 66.4 40.3 - 47.4 %    Platelets 243 140 - 400 10E3/uL    MPV 8.6 7.5 - 11.5 fL   POC Glucose Monitoring Device    Collection Time: 12/23/19  2:41 PM   Result Value Ref Range    POC Glucose Monitoring Device 152 (H) 70 - 100 mg/dL       A/P: Rebecca Brady is a 24 y.o. G2P0101 at [redacted]w[redacted]d by  9 week ultrasound presenting s/p SLP for monitoring and rule out DKA given intraoperative hyperglycemia.     Hyperglycemia without DKA  - 236 upon arrival, patient dosed 4U and 1hr recheck is 180 -> 156  - Of note, glucometer here is reading 180, the patient's sensor was reading 134  - Clinically and hemodynamically stable, VSS, Rh+  - Irregular contractions on toco (4-5 in 1hr)  - Exam without evidence of tachypnea, abdominal pain, headache  - Labs sent significant for anion gap 7, ph 7.44, K 3.4, BHB 0.37  - no evidence of DKA  - Given need for  improvement in BG control and recent surgical procedure requiring 24hr post-procedure observation, plan to admit for monitoring of blood sugars  - Insulin pump agreement signed and in chart  - diabetes in pregnancy managed by primary OB in [place], [place]    Mo/Di Twins  Twin-twin transfusion s/p SFLP and amnioreduction on 12/16  - Ultrasound was completed on 12/20/19   - 12/15 (19+3) Twin A (donor): 218g, AC 9%, cephalic, maternal left, anterior, anhydramnios, bladder not identified, echogenic bowel, Twin B (recipient): 350g, AC 89%, variable, anterior, DVP 14.1cm, bilateral renal pelviectasis, FW discordancy 38% ??  - s/p SFLP and 1.5L amnioreduction on 12/16  - Started on procardia 20q6 yesterday, tachycardic in OR  - will continue on procardia 10mg  q6hr and indocin 25mg  q6hr for 24 hrs    Hx Preeclampsia w/ SF  - Has history of PreE SF in last pregnancy at 36wk  - BP wnl, no s/sx of pre-e at this time    ??  Fetal status  - FHT wnl x2  - Twin A (donor): 218g, B (recipient): 350g on 12/15    GBS unk  ??  COVID   - negative yesterday at [facility]  ??  Prenatal Care  - Rh positive  - Rubella immune  - Trepia NR  - BCM: undecided  ??  Disposition:   - admit to ante for monitoring        Vergie Living,  MD  Ob/Gyn PGY-3

## 2019-12-23 NOTE — Unmapped (Signed)
Pt off toco for shower. Dr. Ileene Musa aware. Pt to alert RN after shower to be put back on monitor.

## 2019-12-23 NOTE — Unmapped (Signed)
Pt transferred from [facility] following SFLP  For glucose management.  Pt alert and oriented X 3.  Denies any discomfort at this time but does report feeling intermittent mild contractions.  Pt placed on TOCO. Initial FSBS 236.  Dr. Ilsa Iha aware, pt making adjustments on Insulin Pump.  Labs drawn and sent  As ordered.

## 2019-12-23 NOTE — Unmapped (Signed)
Pt placed back on toco after shower.

## 2019-12-23 NOTE — Unmapped (Signed)
FHT at bedside.    Baby A 163bpm  Baby B 167bpm    Ghina Bittinger M. Maryjean Ka, MD  OB/GYN PGY-1

## 2019-12-23 NOTE — Unmapped (Signed)
Assessment completed and documented. Vital signs stable.  Pt states minor pain from occasional contractions, but that the pain is tolerable. Pt feeling twin fetal movement with no LOF or vaginal bleeding. Pt states that she feels occasional contractions, but hasn't had a contraction in a couple of hours. Pt on continuous toco overnight. Call light within reach. All needs met at this time. Will continue to monitor.        Problem: Daily Care  Goal: Daily care needs are met  Description: Assess and monitor ability to perform self care and identify potential discharge needs.  Outcome: Progressing     Problem: Vitals  Goal: Patient vital signs are stable  Outcome: Progressing     Problem: Dressing  Goal: Dressing intact until removed with any drainage marked  Outcome: Progressing     Problem: Elimination  Goal: Elimination patterns are normal or improving  Outcome: Progressing     Problem: OB Nutrition  Goal: Maintain adequate nutrtion/hydration  Outcome: Progressing     Problem: Psychosocial Needs  Goal: Demonstrates ability to cope with hospitalization/illness  Description: Assess and monitor patients ability to cope with his/her illness.  Outcome: Progressing

## 2019-12-23 NOTE — Unmapped (Signed)
RN notified by Dr. Ileene Musa that another blood sugar needed to be taken on pt after previous blood sugar was elevated at 222. Per Dr. Ileene Musa, an additional blood sugar would need to be taken at midnight and 3am since the pt's insulin pump settings were changed at the beginning of the shift by the MD. RN made pt aware of plan regarding blood sugar checks for the night.

## 2019-12-24 LAB — OB URINE DRUG SCREEN REFLEX TO CONFIRMATION
Amphetamine, 500 ng/mL Cutoff: NEGATIVE
Barbiturates UR, 300  ng/mL Cutoff: NEGATIVE
Benzodiazepines UR, 300 ng/mL Cutoff: POSITIVE — AB
Buprenorphine, 5 ng/mL Cutoff: NEGATIVE
Cocaine UR, 300 ng/mL Cutoff: NEGATIVE
Fentanyl, 2 ng/mL Cutoff: NEGATIVE
Methadone, UR, 300 ng/mL Cutoff: NEGATIVE
Opiates UR, 300 ng/mL Cutoff: NEGATIVE
Oxycodone, 100 ng/mL Cutoff: NEGATIVE
THC UR, 50 ng/mL Cutoff: NEGATIVE
Tricyclic Antidepressants, 300 ng/mL Cutoff: NEGATIVE

## 2019-12-24 LAB — OB URINE DRUG CONFIRMATION: Midazolam: 2 ng/mL

## 2019-12-24 LAB — POC GLU MONITORING DEVICE
POC Glucose Monitoring Device: 81 mg/dL (ref 70–100)
POC Glucose Monitoring Device: 130 mg/dL (ref 70–100)
POC Glucose Monitoring Device: 118 mg/dL — ABNORMAL HIGH (ref 70–100)
POC Glucose Monitoring Device: 118 mg/dL (ref 70–100)
POC Glucose Monitoring Device: 226 mg/dL (ref 70–100)
POC Glucose Monitoring Device: 65 mg/dL (ref 70–100)
POC Glucose Monitoring Device: 89 mg/dL (ref 70–100)

## 2019-12-24 LAB — HEMOGLOBIN A1C: Hemoglobin A1C: 6.9 % (ref 4.0–5.6)

## 2019-12-24 MED ORDER — NIFEdipine (PROCARDIA) 10 MG capsule
10 | ORAL_CAPSULE | Freq: Four times a day (QID) | ORAL | 5 refills | Status: AC
Start: 2019-12-24 — End: ?

## 2019-12-24 MED FILL — NIFEDIPINE 10 MG CAPSULE: 10 10 MG | ORAL | Qty: 1

## 2019-12-24 MED FILL — PRENATAL PLUS (CALCIUM CARBONATE) 27 MG IRON-1 MG TABLET: 27 27 mg iron- 1 mg | ORAL | Qty: 1

## 2019-12-24 MED FILL — INDOMETHACIN 25 MG CAPSULE: 25 25 MG | ORAL | Qty: 1

## 2019-12-24 NOTE — Nursing Note (Signed)
Reassessment unchanged from previous. VS stable. Pt on continuous toco with occasional contractions noted. Call light within reach. All needs met at this time.

## 2019-12-24 NOTE — Unmapped (Signed)
Ultrasound completed  Please see report under the imaging tab        Lucia Bitter, MD PGY 7  MFM Fellow, [facility]  Attending: Dr. Marty Heck

## 2019-12-24 NOTE — Unmapped (Addendum)
Made adjustments to the insulin to carb ratios:    Pharmacy to fill patient supplied insulin pump reservoir with 3 ml (300 units) insulin ASPART.    Insulin pump settings as follows:  Basal rates:   0000: 1.2 u/h   0300: 1.1 u/h   0700: 1.3 u/h   1300: 1.3 u/h  1900: 1.3 u/h   2200: 1.2 u/h    Insulin to carb ratios (ICRs):   0000: 1:5 u/gms  (was 1:6)  0600: 1:1 u/gms  (was 1:2)  1000: 1:2 u/gms  (was 1:3)  1400: 1:1 u/gms  (was 1:2)  2200: 1:1 u/gms  (was 1:2)    Insulin sensitivity factors (ISFs):   0000:1:70 u/mg/dL   1610:9:60 u/mg/dL   4540:9:81 u/mg/dL     BG targets in pump:   0000: 100 mg/dL  1914: 782 mg/dl  9562: 130 mg/dL     Active Insulin Curve:  3 hours

## 2019-12-24 NOTE — Unmapped (Signed)
Pt assessment complete and WDL. Pt denies any LOF, VB, or contractions. Pt reports + FM. Denies any pain or other issues. CLWR. RN number on whiteboard.

## 2019-12-24 NOTE — Unmapped (Signed)
This patient has no babies on file.    [facility]  OB/GYN Discharge Summary     Patient: Rebecca Brady  Age: 24 y.o.    MRN: [id number]   CSN: [id number]    Prenatal care: [facility]/ [place], [place]    Date of admission: 12/23/2019  Date of discharge: 12/25/2019     Attending: Dr Maryruth Hancock    Diagnoses Present on Admission  IUP at [redacted]w[redacted]d   T1DM on pump  TTTS s/p SFLP/amnioreduction 1.5L 12/16 at [facility]  Anxiety/depression  h/o preE w/ SF     Diagnosis Present on Discharge  IUP at [redacted]w[redacted]d  T1DM on pump  TTTS s/p SFLP/amnioreduction 1.5L 12/16 at [facility]  Anxiety/depression  h/o preE w/ SF         Imaging  IMPRESSION:    TTTS [place]   1.  Status post selective fetoscopic laser    photocoagulation.    2.  Apparent improvement of TTTS    3. The intertwin membrane is intact.    4. Small chorion-amnion separation visualized         Discharge medications:         Medication List        TAKE these medications, which are NEW        Quantity/Refills   NIFEdipine 10 MG capsule  Commonly known as: PROCARDIA  Take 1 capsule (10 mg total) by mouth every 6 hours.   Quantity: 120 capsule  Refills: 5               Where to Get Your Medications        These medications were sent to [facility]  [address] [place], [place] [address]      Hours: Monday - Friday: 8:00AM - 5:00PM Phone: [phone]   NIFEdipine 10 MG capsule         Allergy:  No Known Allergies      Indications for admission  24 y.o. G2P0101 at [redacted]w[redacted]d by 9 week ultrasound presenting s/p SLP for monitoring and rule out DKA given intraoperative hyperglycemia.     Hospital Course  T1DM: She also has Type 1 DM on an insulin pump with suboptimal control prior to admission. Surrounding surgery glucose >200 and patient sent here for evaluation and management of glucose. She was ruled out for DKA and after another glucose >200 and patient report of glucoses average of 160s, we increased her basal rates by 10% and adjusted her insulin to carbohydrate  ratios as well 10%. She is managed by her endocrinologist in [place] for her DM and will plan to follow-up with them once discharged. See notes for insulin pump settings.       Mo/Di Twins  Twin-twin transfusion s/p SFLP and amnioreduction on 12/16  She is s/p SFLP and 1.5L amnioreduction on 12/16 at [facility]. Started on procardia 20q6hr but 2/2 tachycardia, her dose was decreased to 10mg  q6hr. On day of discharge, she was feeling well without contractions. She will have follow-up with [facility] clinic.        Rh status:    Lab Results   Component Value Date    RH Positive 12/23/2019       Discharge Instructions  1. Condition on discharge - stable  2. Activity - as tolerated    3. Diet - regular  4. Patient to return with uncontrolled abdominal pain, fevers >100.3, uncontrolled nausea/vomiting, vaginal bleeding, leakage of fluid, contractions, decreased fetal movement, or any other concerning  symptoms.  5. Follow up -     Future Appointments   Date Time Provider Department Center   12/27/2019  7:45 AM PERI FETAL [facility] PERI [facility]   01/10/2020  7:40 AM PUSND SONO RM 1 [facility] PUS [facility]       Vergie Living, MD  Ob/Gyn PGY-3

## 2019-12-24 NOTE — Unmapped (Signed)
Call [phone] or return for:  1. Vaginal bleeding  2. Leakage of fluid  3. Contractions occuring less than 10 minutes apart that do not improve with rest, tylenol, and hydration  4. Decreased fetal movement: perform kick counts. Count how long it takes to get to 10 movements.  You should have at least 10 movements or kicks within 1 hour.  5. Headache that doesn't go away with tylenol, visual changes, pain on right side under the ribs, severe range blood pressures (over 160/110)    Keep your upcoming OBGYN appointment:  No future appointments.    [employer] ([place]).Twin and Triplet Pregnancy. [place]: [employer] ([place]); 2019.>   Multiple Pregnancy  Multiple pregnancy means that a woman is carrying more than one baby at a time. She may be pregnant with twins, triplets, or more. The majority of multiple pregnancies are twins. Naturally conceiving triplets or more (higher-order multiples) is rare.  Multiple pregnancies are riskier than single pregnancies. A woman with a multiple pregnancy is more likely to have certain problems during her pregnancy.  How does a multiple pregnancy happen?  A multiple pregnancy happens when:  ?? The woman's body releases more than one egg at a time, and then each egg gets fertilized by a different sperm.  ? This is the most common type of multiple pregnancy.  ? Twins or other multiples produced this way are called fraternal. They are no more alike than non-multiple siblings are.  ?? One sperm fertilizes one egg, which then divides into more than one embryo.  ? Twins or other multiples produced this way are called identical. Identical multiples are always the same gender, and they look very much alike.  Who is most likely to have a multiple pregnancy?  A multiple pregnancy is more likely to develop in women who:  ?? Have had fertility treatment, especially if the treatment included fertility medicines.  ?? Are older than 23 years of  age.  ?? Have already had four or more children.  ?? Have a family history of multiple pregnancy.  How is a multiple pregnancy diagnosed?  A multiple pregnancy may be diagnosed based on:  ?? Symptoms such as:  ? Rapid weight gain in the first 3 months of pregnancy (first trimester).  ? More severe nausea and breast tenderness than what is typical of a single pregnancy.  ? A larger uterus than what is normal for the stage of the pregnancy.  ?? Blood tests that detect a higher-than-normal level of human chorionic gonadotropin (hCG). This is a hormone that your body produces in early pregnancy.  ?? An ultrasound exam. This is used to confirm that you are carrying multiples.  What risks come with multiple pregnancy?  A multiple pregnancy puts you at a higher risk for certain problems during or after your pregnancy. These include:  ?? Delivering your babies before your due date (preterm birth). A full-term pregnancy lasts for at least 37 weeks.  ? Babies born before 37 weeks may have a higher risk for breathing problems, feeding difficulties, cerebral palsy, and learning disabilities.  ?? Diabetes.  ?? Preeclampsia. This is a serious condition that causes high blood pressure and headaches during pregnancy.  ?? Too much blood loss after childbirth (postpartum hemorrhage).  ?? Postpartum depression.  ?? Low birth weight of the babies.  How will having a multiple pregnancy affect my care?  Your health care team will monitor you more closely. You may need more  frequent prenatal visits. This will ensure that you are healthy and that your babies are growing normally.  Follow these instructions at home:  Eating and drinking  ?? Increase your nutrition.  ? Follow your health care provider's recommendations for weight gain. You may need to gain a little extra weight when you are pregnant with multiples.  ? Eat healthy snacks often throughout the day. This will add calories and reduce nausea.  ?? Drink enough fluid to keep your urine pale  yellow.  ?? Take prenatal vitamins. Ask your health care provider what vitamins are right for you.  Activity  Limit your activities by 20-24 weeks of pregnancy.  ?? Rest often.  ?? Avoid activities, exercise, and work that take a lot of effort.  ?? Ask your health care provider when you should stop having sex.  General instructions  ?? Do not use any products that contain nicotine or tobacco, such as cigarettes, e-cigarettes, and chewing tobacco. If you need help quitting, ask your health care provider.  ?? Do not drink alcohol or use illegal drugs.  ?? Take over-the-counter and prescription medicines only as told by your health care provider.  ?? Arrange for extra help around the house.  ?? Keep all follow-up visits and all prenatal visits as told by your health care provider. This is important.  Where to find more information  ?? American College of Obstetricians and Gynecology: www.acog.org  Contact a health care provider if:  ?? You have dizziness.  ?? You have nausea, vomiting, or diarrhea that does not go away.  ?? You have depression or other emotions that are interfering with your normal activities.  ?? You have a fever.  ?? You have pain with urination.  ?? You have a bad-smelling vaginal discharge.  ?? You notice increased swelling in your face, hands, legs, or ankles.  Get help right away if:  ?? You have fluid leaking from your vagina.  ?? You have bleeding from your vagina.  ?? You have pelvic cramps, pelvic pressure, or nagging pain in your abdomen or lower back.  ?? You are having regular contractions.  ?? You have a severe headache, with or without changes in how you see.  ?? You have chest pain or shortness of breath.  ?? You notice that your babies move less often, or do not move at all.  Summary  ?? Having a multiple pregnancy means that a woman is carrying more than one baby at a time.  ?? A multiple pregnancy puts you at a higher risk for delivering your babies before your due date, having diabetes, preeclampsia, too  much blood loss after childbirth, or low birth weight of the babies.  ?? Your health care provider will monitor you more closely during your pregnancy.  ?? You may need to make some lifestyle changes during pregnancy. This includes eating more, limiting your activities after 20-24 weeks of pregnancy, and arranging for extra help around the house.  ?? Follow up with your health care provider as instructed if you experience any complications.  This information is not intended to replace advice given to you by your health care provider. Make sure you discuss any questions you have with your health care provider.  Document Revised: 08/17/2018 Document Reviewed: 08/17/2018  Elsevier Patient Education ?? 2021 [employer].

## 2019-12-24 NOTE — Unmapped (Addendum)
Antepartum Progress Note    Hospital Day: 2  Gestational Age: 105w5d by 46wk [place]  Reason for Admission: R/o DKA, monitoring s/p SLFP    Subjective:   Patient is overall doing well without complaints. Denies vaginal bleeding, LOF, or contractions. Endorses normal fetal movement. Denies headache, visual changes, RUQ pain, or edema.       Objective:  Temp:  [97.9 ??F (36.6 ??C)-98.4 ??F (36.9 ??C)] 98.1 ??F (36.7 ??C)  Heart Rate:  [85-118] 108  Resp:  [16-18] 17  BP: (111-130)/(56-89) 117/63      Intake/Output Summary (Last 24 hours) at 12/24/2019 1610  Last data filed at 12/23/2019 2340  Gross per 24 hour   Intake --   Output 400 ml   Net -400 ml       Gen: NAD  Resp: no work of breathing   CV: well perfused  Abdomen: soft, gravid, non tender, non distended. Port site c/d/i  Ext: warm, well perfused. No edema. 2+ pulses throughout. No calf tenderness.        Labs:  Recent Results (from the past 24 hour(s))   POC Glucose Monitoring Device    Collection Time: 12/23/19 12:38 PM   Result Value Ref Range    POC Glucose Monitoring Device 236 (H) 70 - 100 mg/dL   Venous Blood Gas, Line/Syringe    Collection Time: 12/23/19  1:00 PM   Result Value Ref Range    PH-Line Draw 7.44 (H) 7.32 - 7.42    PCO2-Line Draw 34 (L) 41 - 51 mm Hg    PO2-Line Draw 82 (H) 25 - 40 mm Hg    HCO3-Line Draw 23 (L) 24 - 28 mmol/L    CO2 Content-Line Draw 24 (L) 25 - 29 mmol/L    Base Excess-Line Draw -0.6 -2.0 - 3.0 mmol/L    %HBO2-Line Draw 95.5 (H) 40.0 - 70.0 %    Carboxyhgb-Line Draw 1.2 %    Methemoglobin-Line Draw 0.6 0.0 - 1.5 %    Reduced Hemoglobin-Line Draw 2.7 0.0 - 5.0 %   Ketones, Blood (BHB)    Collection Time: 12/23/19  1:01 PM   Result Value Ref Range    Beta-Hydroxybuterate 0.37 (H) 0.02 - 0.27 mmol/L   Comprehensive Metabolic Panel    Collection Time: 12/23/19  1:01 PM   Result Value Ref Range    Sodium 136 133 - 146 mmol/L    Potassium 3.4 (L) 3.5 - 5.3 mmol/L    Chloride 106 98 - 110 mmol/L    CO2 23 21 - 33 mmol/L    Anion Gap 7 3 - 16  mmol/L    BUN 7 7 - 25 mg/dL    Creatinine 9.60 4.54 - 1.30 mg/dL    Glucose 098 (H) 70 - 100 mg/dL    Calcium 8.2 (L) 8.6 - 10.3 mg/dL    Total Bilirubin 0.6 0.0 - 1.5 mg/dL    AST 13 13 - 39 U/L    ALT 6 (L) 7 - 52 U/L    Alkaline Phosphatase 43 36 - 125 U/L    Total Protein 5.6 (L) 6.4 - 8.9 g/dL    Albumin 2.9 (L) 3.5 - 5.7 g/dL    Osmolality, Calculated 286 278 - 305 mOsm/kg    eGFR AA CKD-EPI >90 See note.    eGFR NONAA CKD-EPI >90 See note.   POC Glucose Monitoring Device    Collection Time: 12/23/19  1:38 PM   Result Value Ref Range    POC  Glucose Monitoring Device 180 (H) 70 - 100 mg/dL   CBC    Collection Time: 12/23/19  2:12 PM   Result Value Ref Range    WBC 11.8 (H) 3.8 - 10.8 10E3/uL    RBC 3.24 (L) 3.80 - 5.10 10E6/uL    Hemoglobin 9.8 (L) 11.7 - 15.5 g/dL    Hematocrit 16.1 (L) 35.0 - 45.0 %    MCV 87.1 80.0 - 100.0 fL    MCH 30.4 27.0 - 33.0 pg    MCHC 34.9 32.0 - 36.0 g/dL    RDW 09.6 04.5 - 40.9 %    Platelets 243 140 - 400 10E3/uL    MPV 8.6 7.5 - 11.5 fL   POC Glucose Monitoring Device    Collection Time: 12/23/19  2:41 PM   Result Value Ref Range    POC Glucose Monitoring Device 152 (H) 70 - 100 mg/dL   ABO/Rh    Collection Time: 12/23/19  3:20 PM   Result Value Ref Range    ABO Grouping O     Rh Type Positive    Antibody Screen    Collection Time: 12/23/19  3:20 PM   Result Value Ref Range    Antibody Screen Negative    Syphilis Screening Daune Perch)    Collection Time: 12/23/19  3:36 PM   Result Value Ref Range    Treponema Pallidum Negative Negative   Blood Bank Sample - No Orders    Collection Time: 12/23/19  4:05 PM   Result Value Ref Range    Blood Bank Sample Info1 Acceptable specimen received in the Blood Bank    POC Glucose Monitoring Device    Collection Time: 12/23/19  6:25 PM   Result Value Ref Range    POC Glucose Monitoring Device 222 (H) 70 - 100 mg/dL   POC Glucose Monitoring Device    Collection Time: 12/23/19 10:30 PM   Result Value Ref Range    POC Glucose Monitoring Device 81  70 - 100 mg/dL   POC Glucose Monitoring Device    Collection Time: 12/24/19 12:08 AM   Result Value Ref Range    POC Glucose Monitoring Device 130 (H) 70 - 100 mg/dL   OB Urine Drug Screen Reflex to Confirmation    Collection Time: 12/24/19  1:19 AM   Result Value Ref Range    Amphetamine, 500 ng/mL Cutoff Negative Negative    Barbiturates UR, 300  ng/mL Cutoff Negative Negative    Buprenorphine, 5 ng/mL Cutoff Negative Negative    Benzodiazepines UR, 300 ng/mL Cutoff Presumptive Positive (A) Negative    Cocaine UR, 300 ng/mL Cutoff Negative Negative    Methadone, UR, 300 ng/mL Cutoff Negative Negative    Opiates UR, 300 ng/mL Cutoff Negative Negative    Oxycodone, 100 ng/mL Cutoff Negative Negative    Tricyclic Antidepressants, 300 ng/mL Cutoff Negative Negative    THC UR, 50 ng/mL Cutoff Negative Negative    Fentanyl, 2 ng/mL Cutoff Negative Negative   POC Glucose Monitoring Device    Collection Time: 12/24/19  3:14 AM   Result Value Ref Range    POC Glucose Monitoring Device 118 (H) 70 - 100 mg/dL        Assessment/Plan:   24 y.o. G2P0101 at [redacted]w[redacted]d by 9 week ultrasound??presenting s/p SLP for monitoring and rule out DKA given intraoperative hyperglycemia.??    Hyperglycemia without DKA  -??236 upon arrival, patient dosed 4U and 1hr recheck is 180??-> 156  - Of note,??glucometer here is  reading 180,??the patient's sensor was reading 134  - Clinically and hemodynamically stable, VSS, Rh+  -??Irregular contractions on toco (4-5 in 1hr)  - Exam without??evidence of tachypnea, abdominal pain, headache  - Labs sent significant for??anion gap 7, ph 7.44, K 3.4, BHB 0.37  - no evidence of DKA  - Given??need for improvement in BG control and recent surgical procedure??requiring 24hr post-procedure observation, plan to admit for??monitoring of blood sugars  - Insulin pump agreement signed and in chart  - diabetes in pregnancy managed by primary OB in [place], [place]  - increased basal rate by 10% on 12/16  ??  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  - s/p SFLP and 1.5L amnioreduction on 12/16  - Started on procardia 20q6 yesterday, tachycardic in OR  - will continue on procardia 10mg  q6hr and indocin 25mg  q6hr for 24 hrs  - plan for repeat US today  ??  Hx Preeclampsia w/ SF  - Has history of PreE SF in last pregnancy at 36wk  - BP wnl, no s/sx of pre-e at this time  ??  Fetal status  - FHT wnl x2  - Twin A (donor): 218g, B (recipient): 350g on 12/15  ??  GBS??unk  ??  COVID   -??negative yesterday at [facility]  ??  Prenatal Care  - Rh??positive  - Rubella??immune  - Daune Perch??NR  - BCM:??undecided  ??  Disposition:   -??stable for DC    Vergie Living, MD  Ob/Gyn PGY-3

## 2019-12-27 ENCOUNTER — Ambulatory Visit

## 2020-01-10 ENCOUNTER — Ambulatory Visit

## 2020-02-18 IMAGING — CR XR CHEST 1 VIEW
1 series · 1 of 1 positions shown · non-contrast
Comparison: none

Exam: Chest one view
The heart is normal in size. Mediastinal contours within normal limits. Trachea is midline. Lungs are clear bilaterally. Bony structures are grossly intact.

[AP]
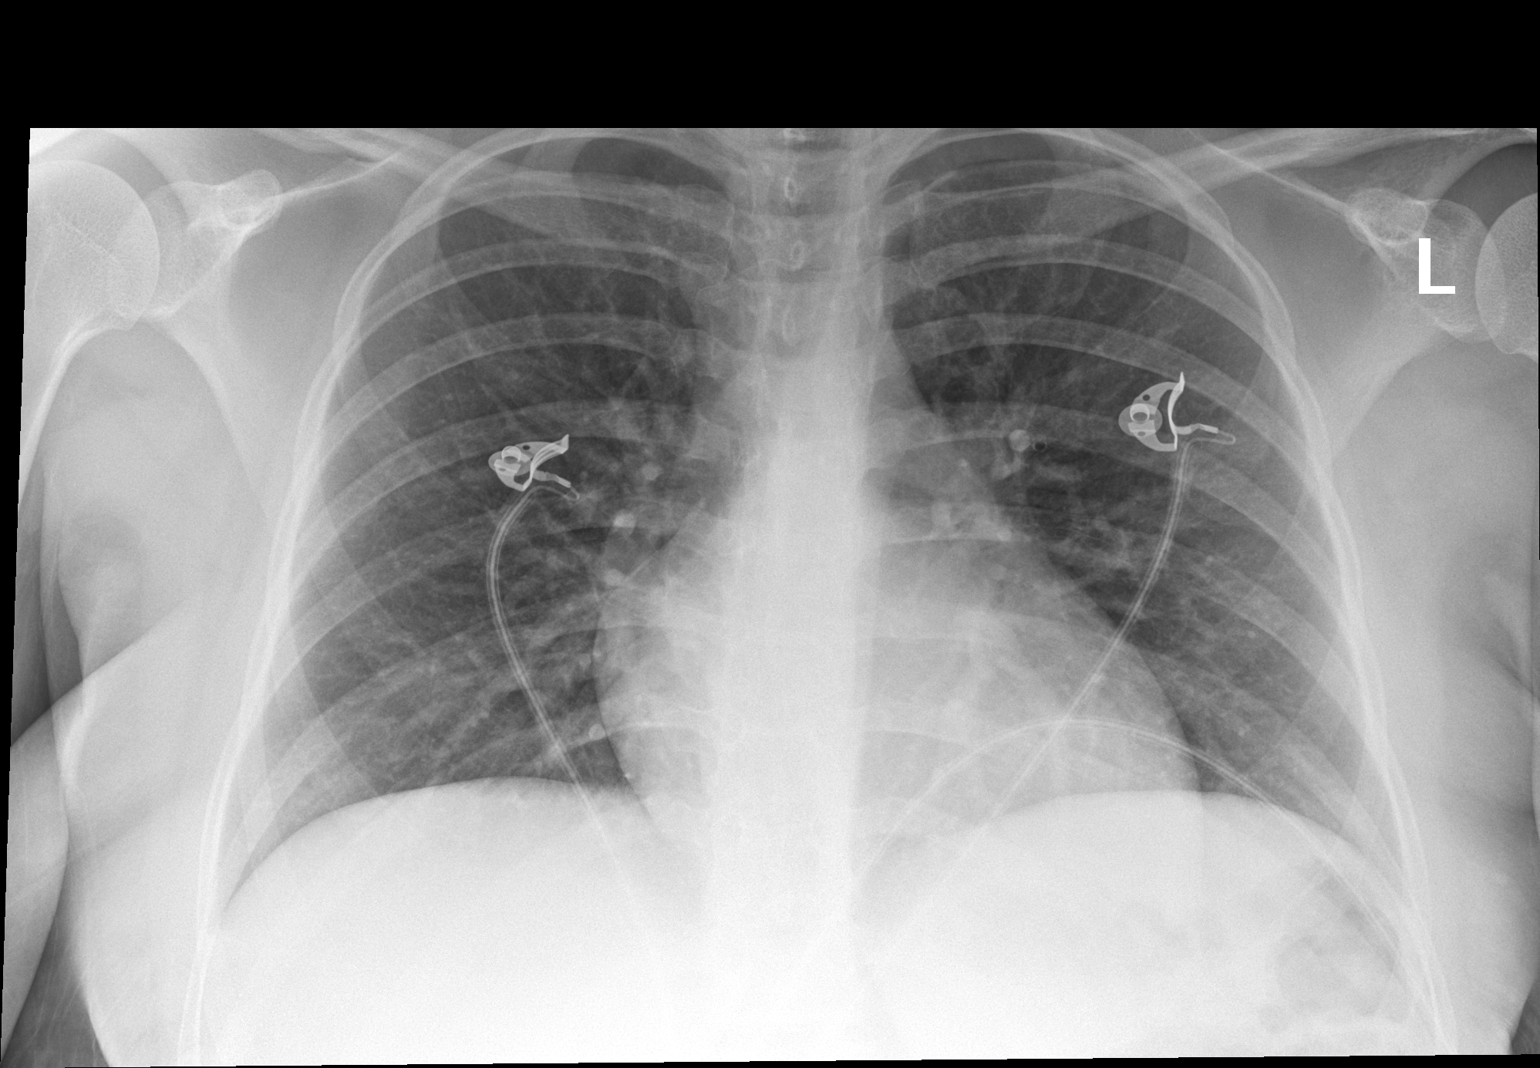

[1 of 1 positions shown; findings below may reference images not displayed]

IMPRESSION: No acute cardia pulmonary process.
Is the patient pregnant?
Yes
Portable?
Yes

## 2020-04-01 IMAGING — CT CT ABDOMEN PELVIS WITH CONTRAST
2 of 4 series · 16 of 46 positions shown, 18 images · non-contrast
Comparison: Prior CT abdomen/pelvis from 08/24/19

CT scan of the abdomen and pelvis. [DATE], 9099 0304 hours
CLINICAL HISTORY: RLQ abdominal pain (Age >= 14y)
TECHNIQUE: Helical axial sections with sagittal and coronal reformats of the abdomen and pelvis were obtained with intravenous contrast. Iterative reconstruction technique was employed to reduce patient radiation exposure.
Contrast Dose:  No contrast dose details were provided at the time of interpretation.
Radiation Dose: Total exam DLP 800.53 mGy/cm.

[Series 2: abd/pel · axial · 0.63mm/px · z∈[-387,+3]mm · 13 of 174 slices shown, 15 images]
[im 9/174  soft-tissue]
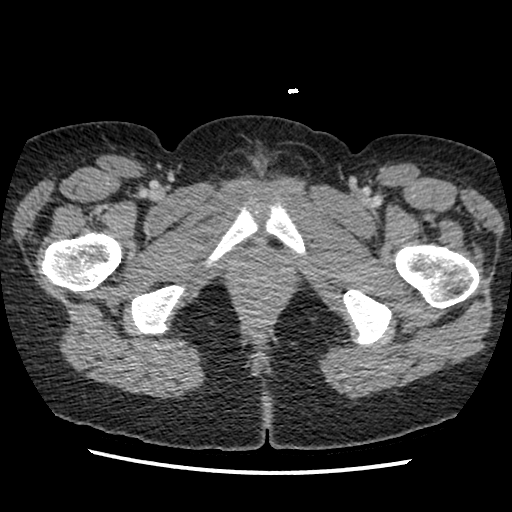
[im 9/174  bone]
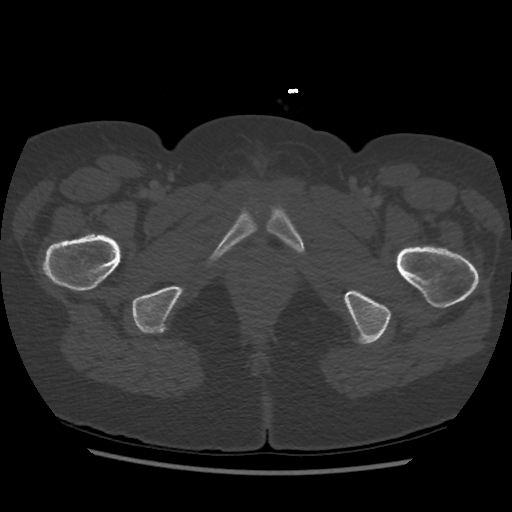
[im 25/174  soft-tissue]
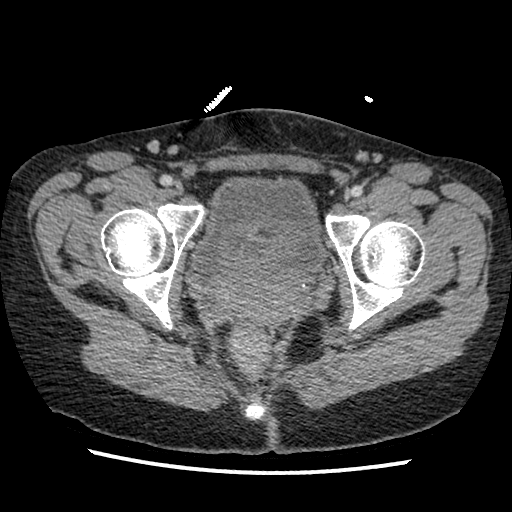
[im 33/174  soft-tissue]
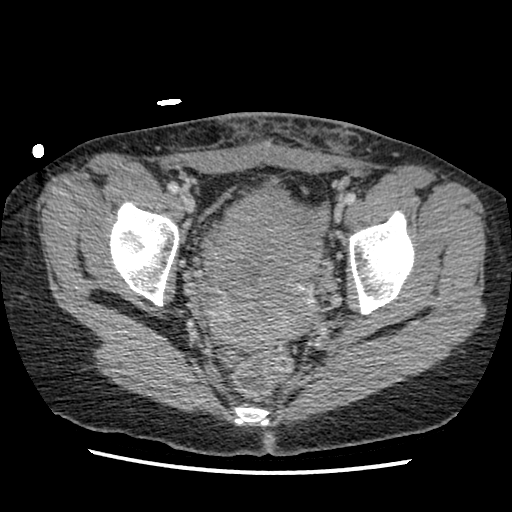
[im 50/174  soft-tissue]
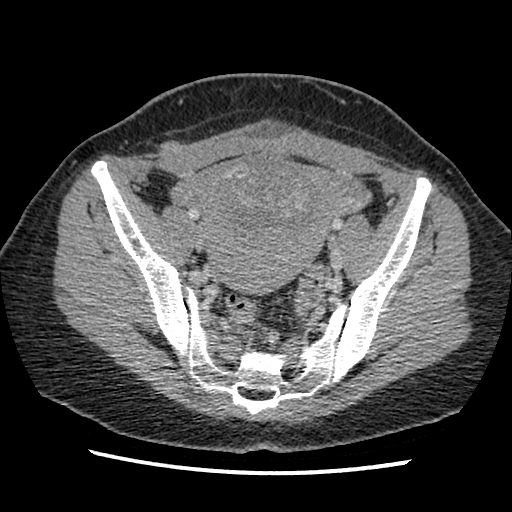
[im 58/174  soft-tissue]
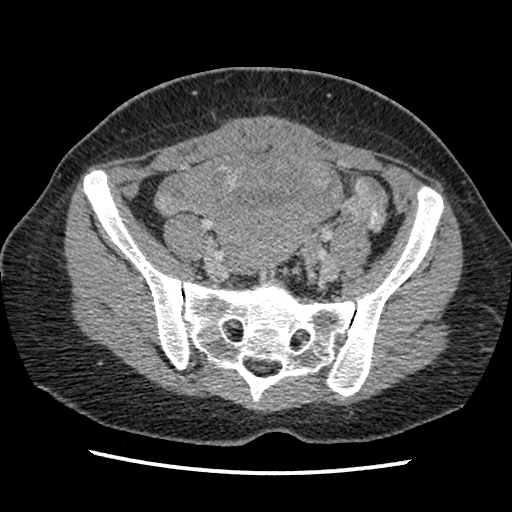
[im 75/174  soft-tissue]
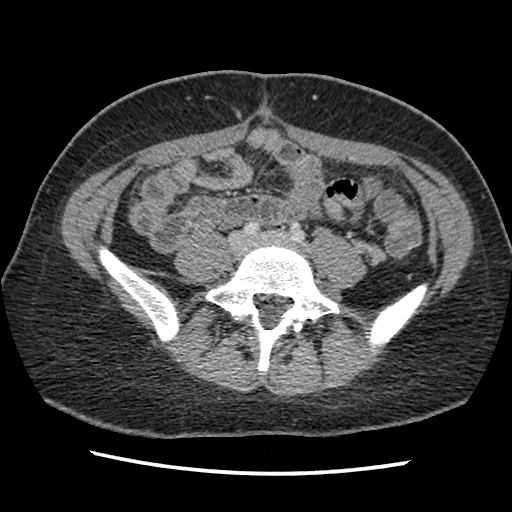
[im 91/174  soft-tissue]
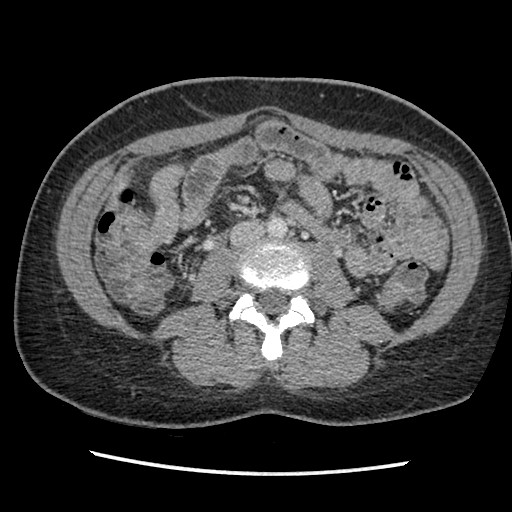
[im 99/174  soft-tissue]
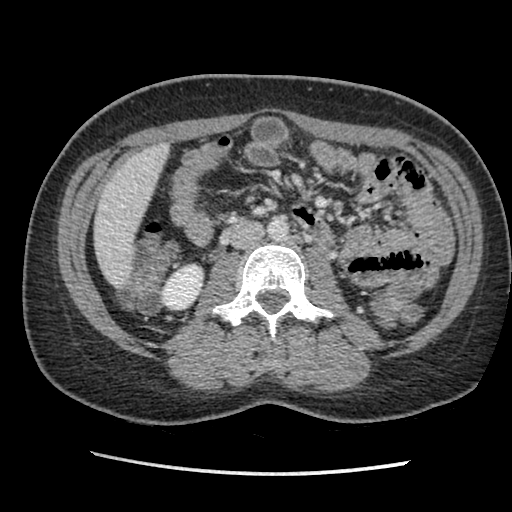
[im 116/174  soft-tissue]
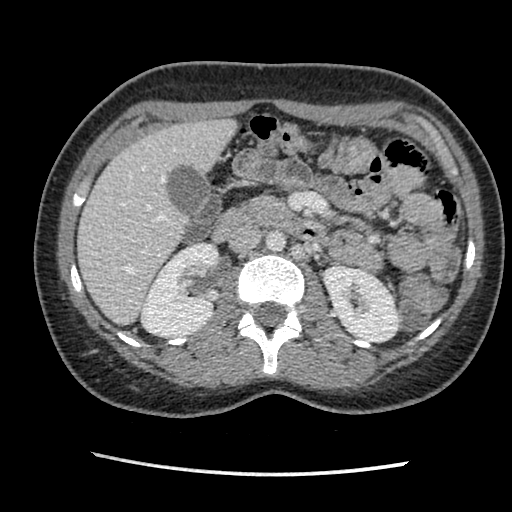
[im 116/174  bone]
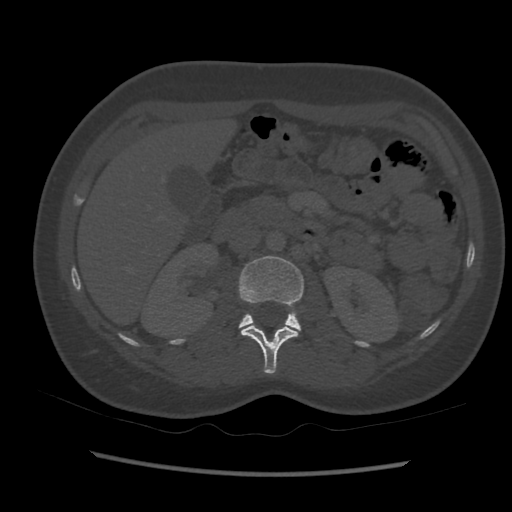
[im 124/174  soft-tissue]
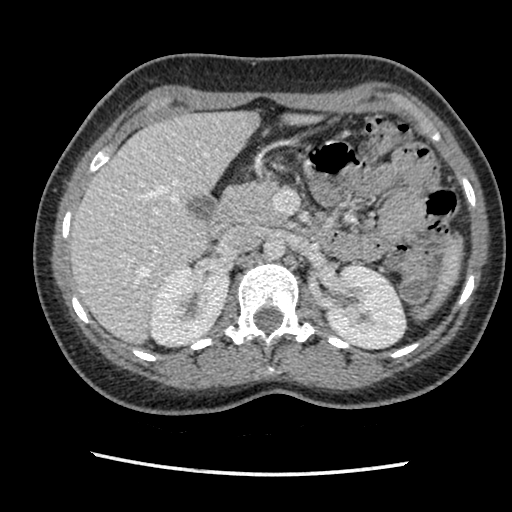
[im 141/174  soft-tissue]
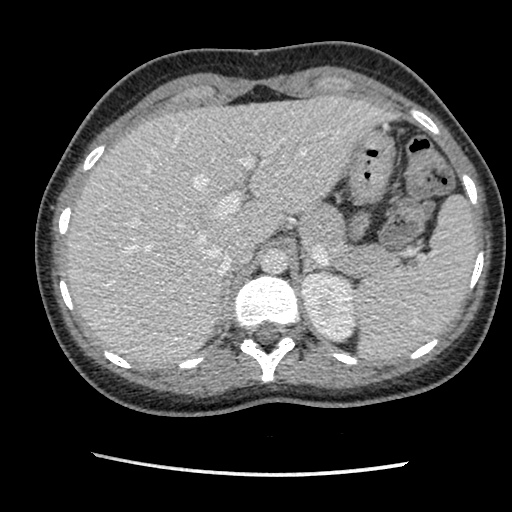
[im 149/174  soft-tissue]
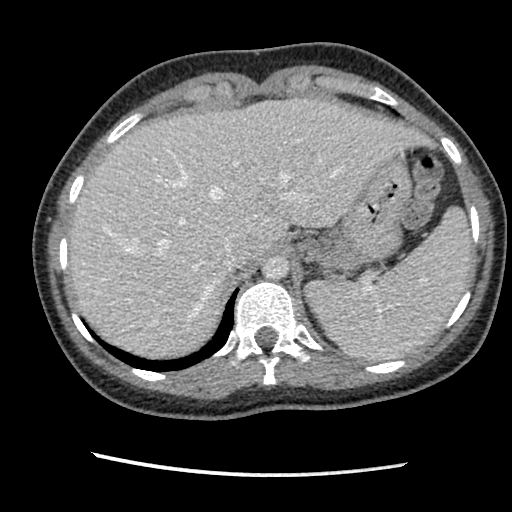
[im 165/174  soft-tissue]
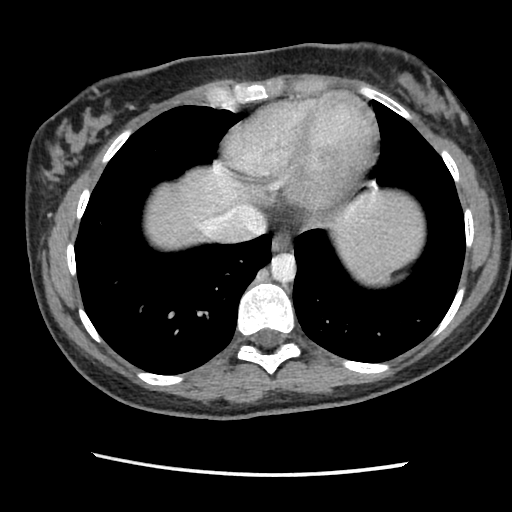

[Series 602: sag standard 2x2 · sagittal · 0.85mm/px · 3 of 163 slices shown]
[im 55/163  soft-tissue]
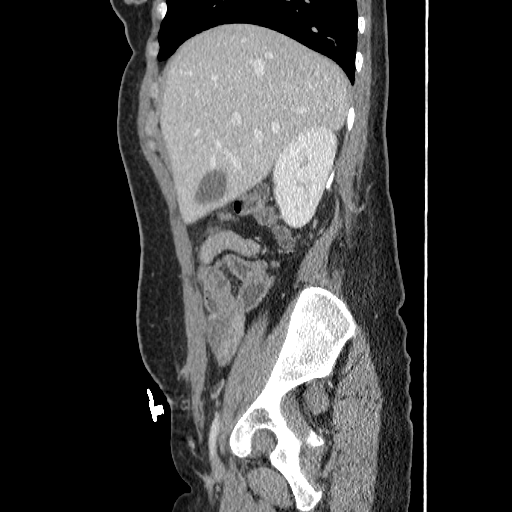
[im 73/163  soft-tissue]
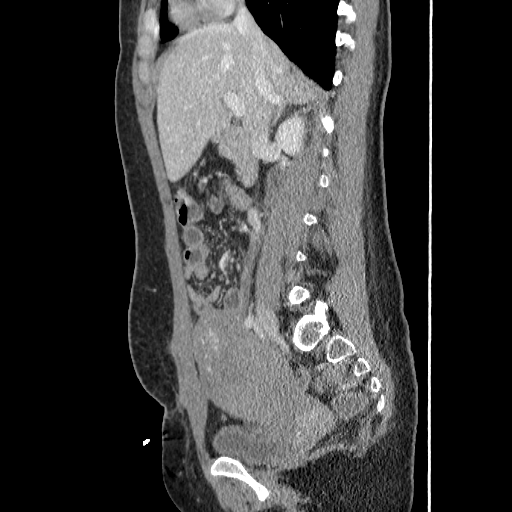
[im 91/163  soft-tissue]
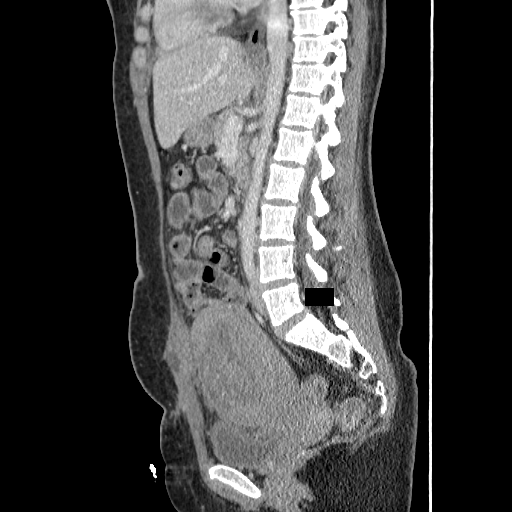

[16 of 46 positions shown; findings below may reference images not displayed]

FINDINGS: There is horizontal configuration of the left lobe of the liver insinuating into the left upper quadrant of the abdomen. The liver, gallbladder, spleen, pancreas, adrenal glands and kidneys are unremarkable.  There is underdistention of the stomach, which limits evaluation. The appendix is visualized and normal in appearance (axial image 90/174). There is suggestion of mild small bowel as well as large bowel thickening which may indicate enterocolitis. There is mild superficial cellulitis suggested along the lower anterior abdominal wall. There is new soft tissue fluid along the lower anterior abdominal wall measuring 5 cm in transverse dimension and 1.5 cm in AP dimension (axial image 122/174). There is mild circumferential urinary bladder wall thickening which may be due to suboptimal distention although cystitis is not excluded. Uterus now appears enlarged, it is unclear if this is related to the postpartum state. Visualized lung bases are clear. Bilateral L5 pars defects are noted
with early grade I anterolisthesis of L5 on S1.
IMPRESSION: 1. Possible enterocolitis, which may be of infectious or inflammatory etiology. No bowel obstruction.  No free intraperitoneal air or fluid.
2. Uterus is now enlarged and boggy appearing, this may indicate postpartum state. Recommend clinical correlation.
3. New mild superficial cellulitis along the lower anterior abdominal wall with a new soft tissue collection along the lower anterior abdominal wall musculature. Recommend follow-up to document resolution.
4. Mild circumferential urinary bladder wall thickening may be due to suboptimal distention although cystitis is not excluded.
5. Bilateral L5 pars defects with early grade I anterolisthesis of L5 on S1.
Is the patient pregnant?
No

## 2020-06-29 IMAGING — US US PELVIS TRANSVAGINAL
1 series · 13 of 25 positions shown · non-contrast
Comparison: none

FINAL REPORT:
Pelvic ultrasound
INDICATION: 3 months postpartum. Pain. Bleeding.
PROCEDURE: Transvaginal and transabdominal images were acquired.

[Series 1: us pelvis transvaginal · 13 of 99 slices shown]
[im 1/99]
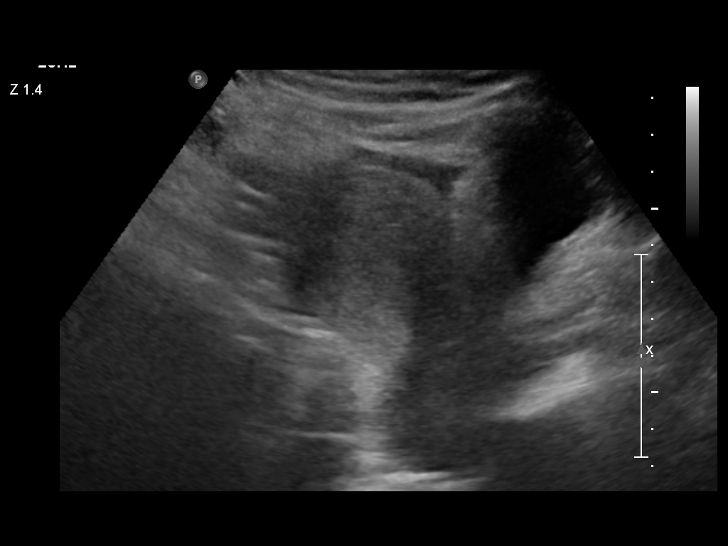
[im 9/99]
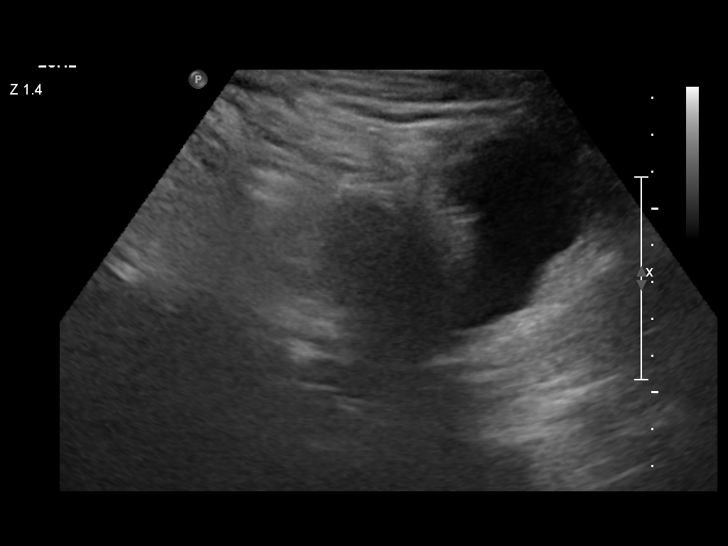
[im 17/99]
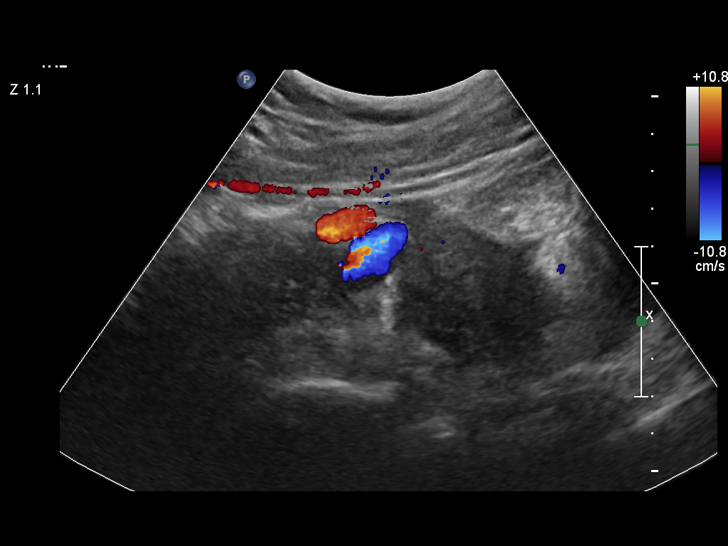
[im 25/99]
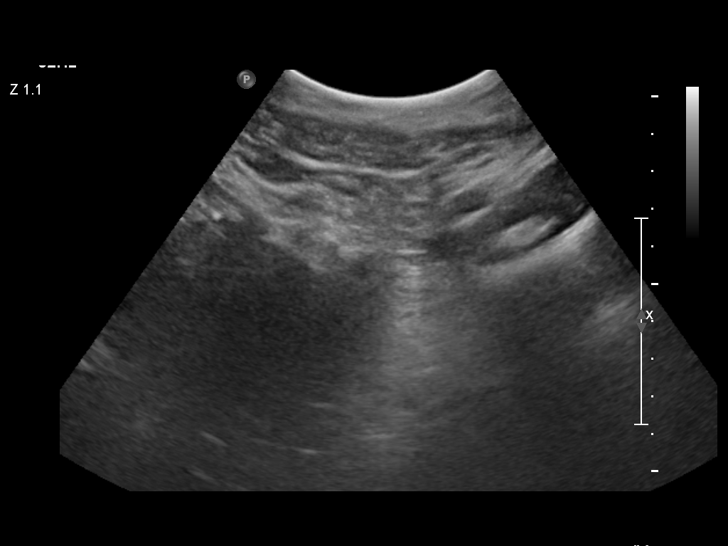
[im 33/99]
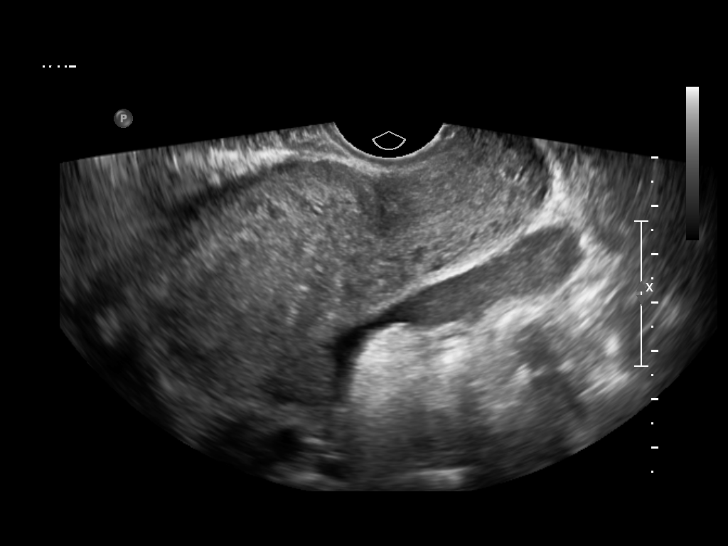
[im 41/99]
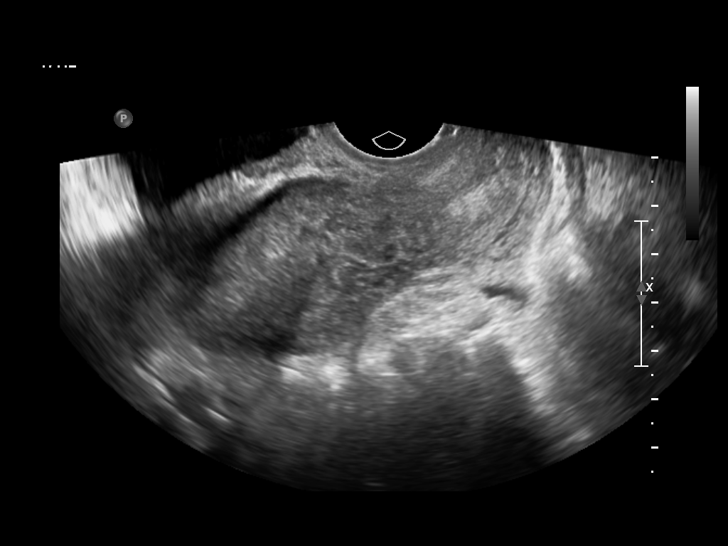
[im 50/99]
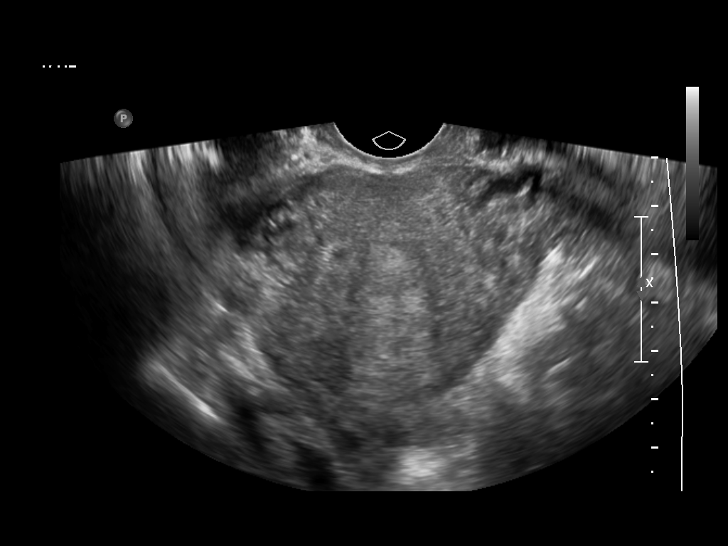
[im 58/99]
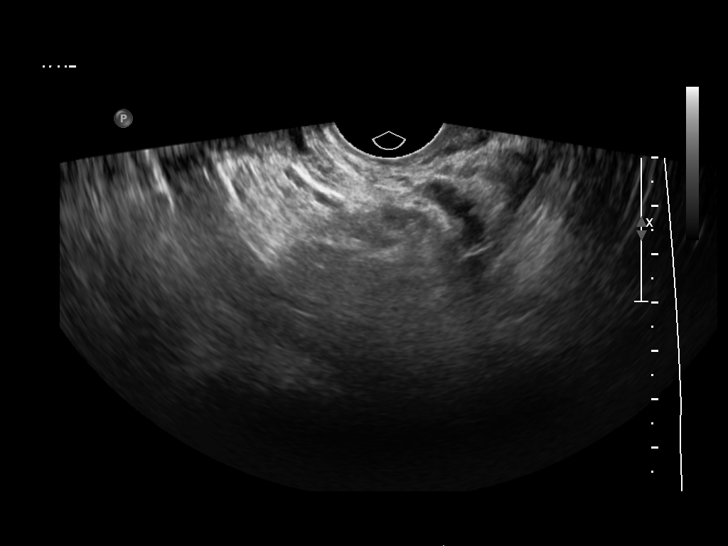
[im 66/99]
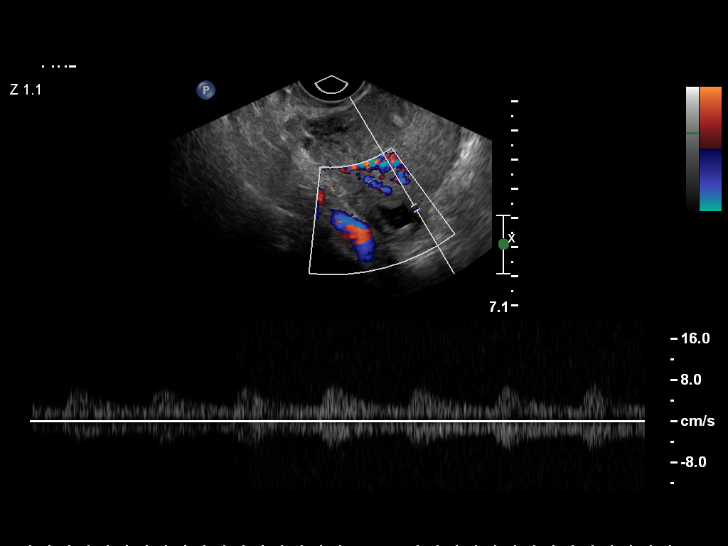
[im 74/99]
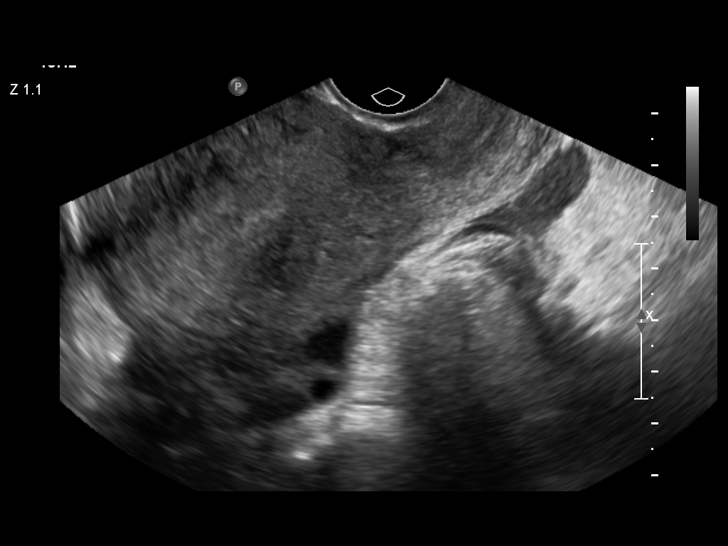
[im 82/99]
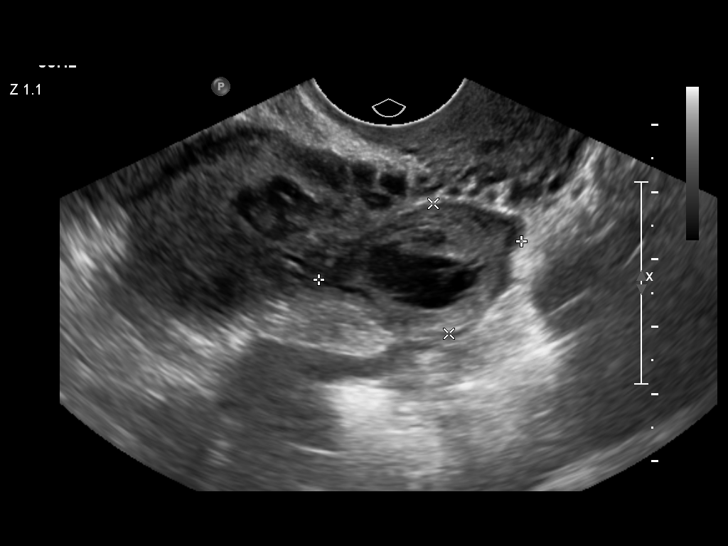
[im 90/99]
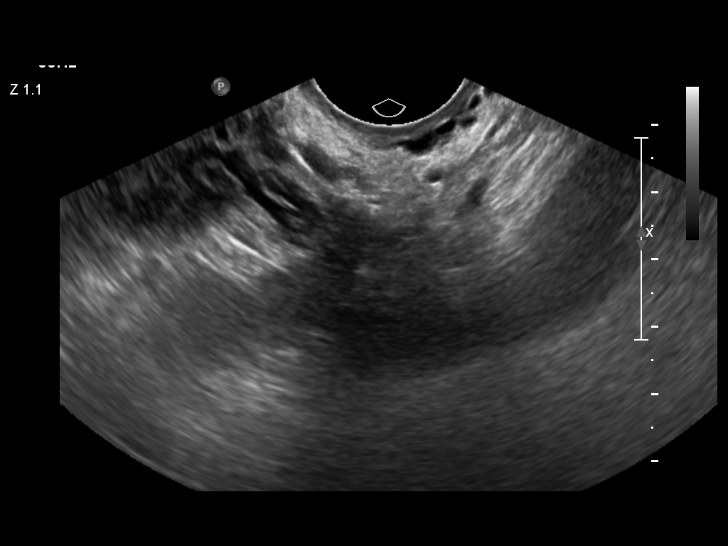
[im 99/99]
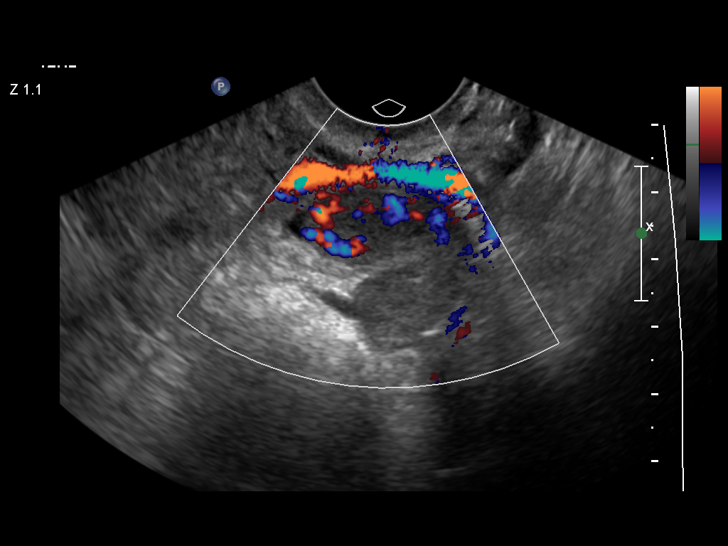

[13 of 25 positions shown; findings below may reference images not displayed]

FINDINGS: The anteverted uterus measures 9.4 x 4.6 x 5.5 cm. Myometrial contour and echogenicity are within normal limits. The endometrial stripe measures 5 mm in thickness in thickness. No abnormal endometrial fluid collection or focus of abnormal vascularity is identified. The cervical canal is within normal limits. The right ovary measures 33 x 15 x 21 mm, and the left ovary measures 31 x 20 x 27 mm. There is a small 18 x 11 mm complex cyst in the left ovary. Color-flow and duplex images of the ovaries are within normal limits. Possible hyperechoic tubular structure in the left adnexal region. Moderate volume free fluid in the cul-de-sac, and there are homogeneously suspended echoes with minimal fluid consistent with some degree of complexity.
IMPRESSION: 1. No evidence of retained products of conception.
2. Small mildly complex physiologic cyst in left ovary, possible corpus luteum.
3. Moderate complex free fluid likely from recent cyst rupture.
4. Cannot exclude left salpingitis in the appropriate clinical setting.

## 2020-06-29 IMAGING — CT CT ABDOMEN PELVIS WITH CONTRAST
2 of 3 series · 17 of 46 positions shown, 19 images · IV contrast (isovue)
Comparison: 04/01/2020

FINAL REPORT:
EXAM: CT ABDOMEN AND PELVIS WITH CONTRAST
HISTORY: LLQ abdominal pain
TECHNIQUE: Contiguous axial images were obtained through the abdomen and pelvis after the uneventful administration of intravenous contrast 100 cc ISOVUE 370. Sagittal and coronal reformatted images were generated.
All CT scans at this facility use dose modulation and/or weight based dosing when appropriate to reduce radiation dose to as low as reasonably achievable.

[Series 2: abd/pel · axial · 0.70mm/px · z∈[-499,-109]mm · 14 of 180 slices shown, 16 images]
[im 12/180  soft-tissue]
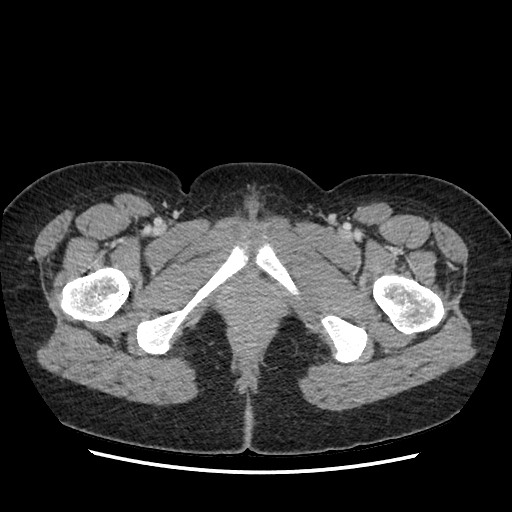
[im 12/180  bone]
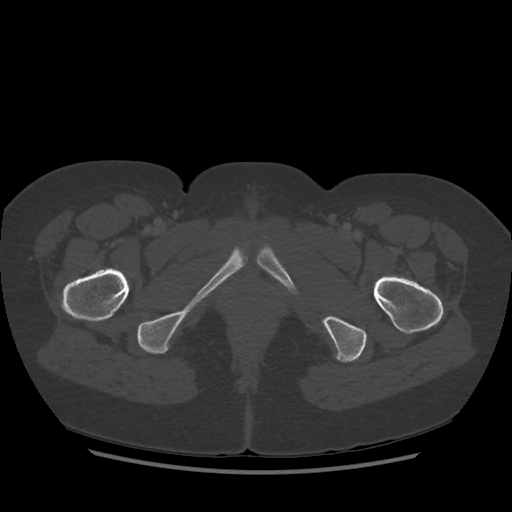
[im 24/180  soft-tissue]
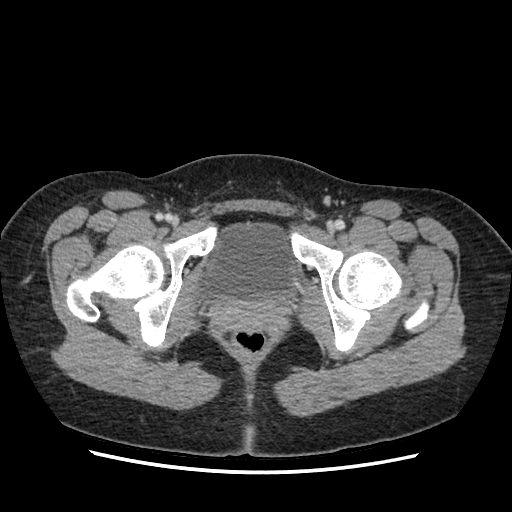
[im 35/180  soft-tissue]
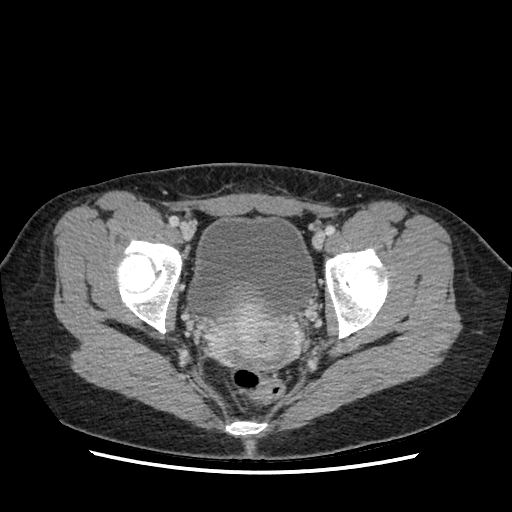
[im 47/180  soft-tissue]
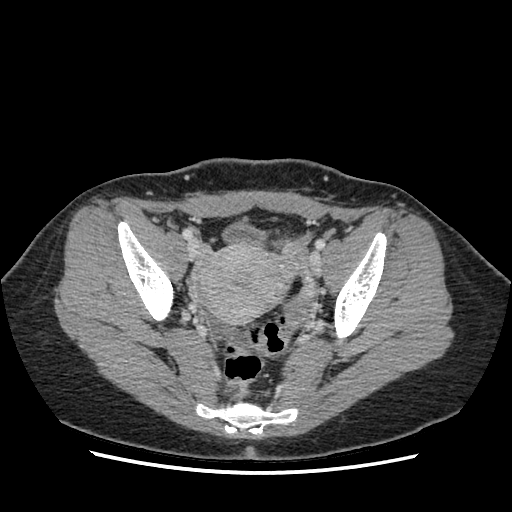
[im 58/180  soft-tissue]
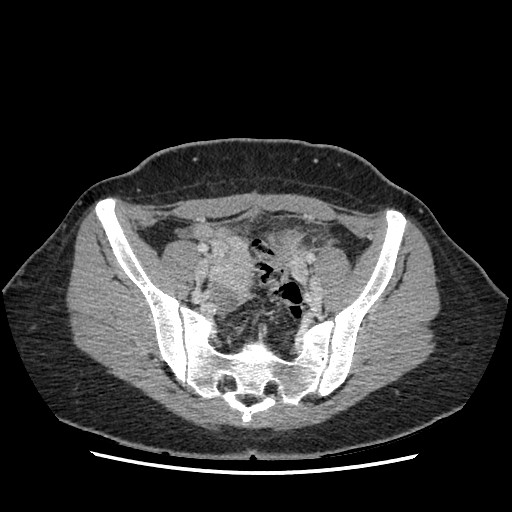
[im 70/180  soft-tissue]
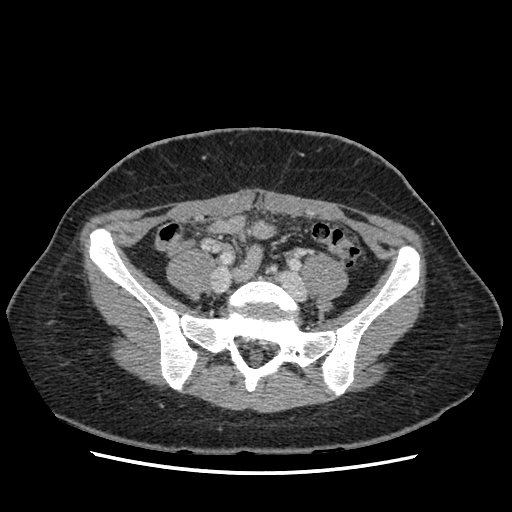
[im 81/180  soft-tissue]
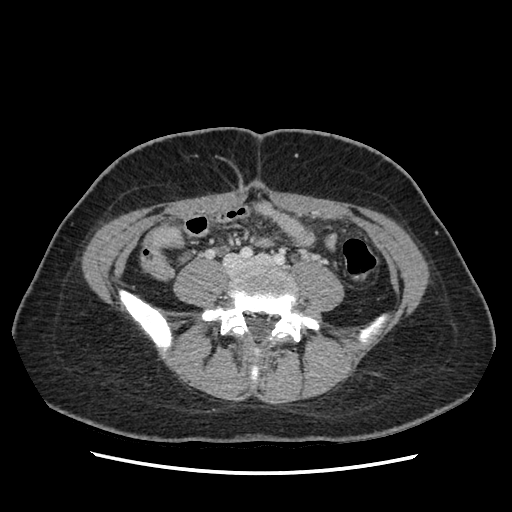
[im 99/180  soft-tissue]
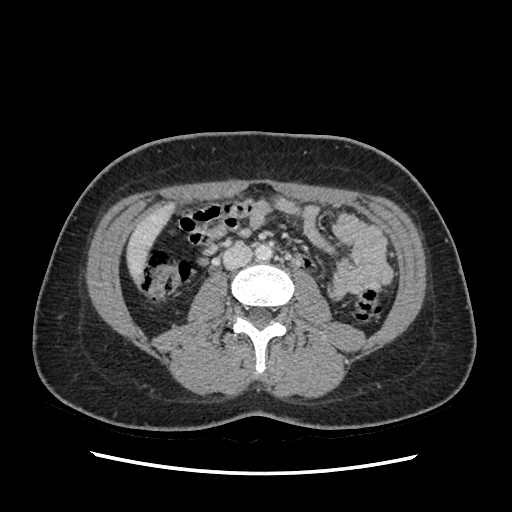
[im 110/180  soft-tissue]
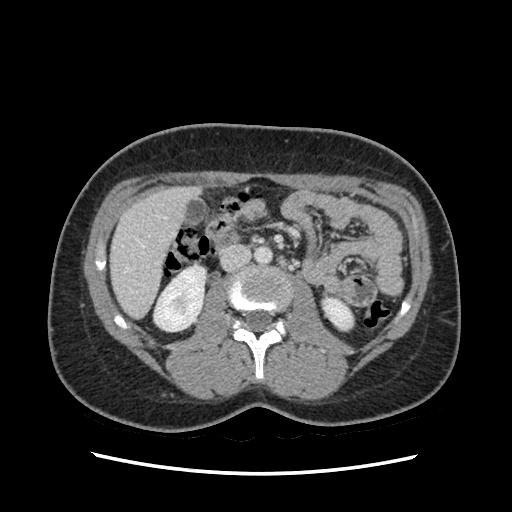
[im 110/180  bone]
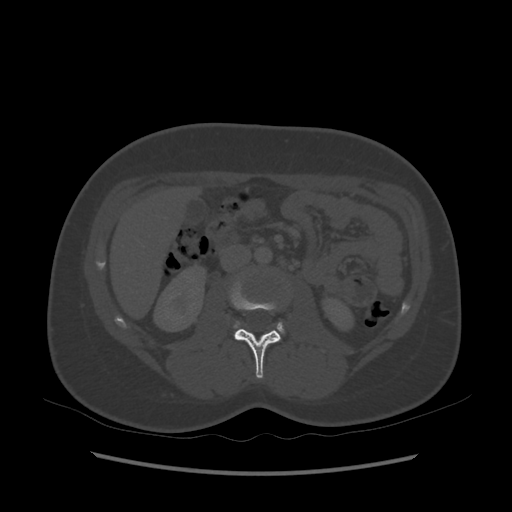
[im 122/180  soft-tissue]
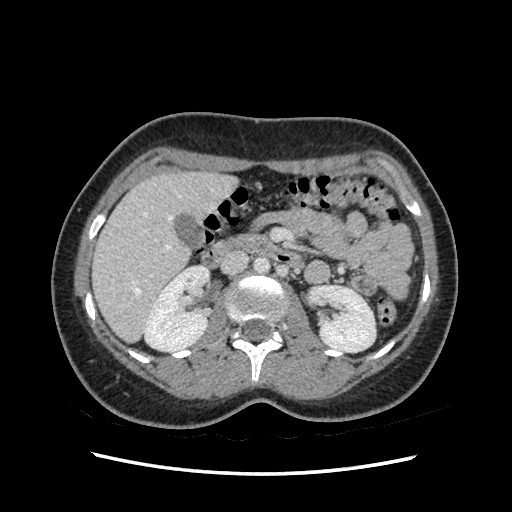
[im 133/180  soft-tissue]
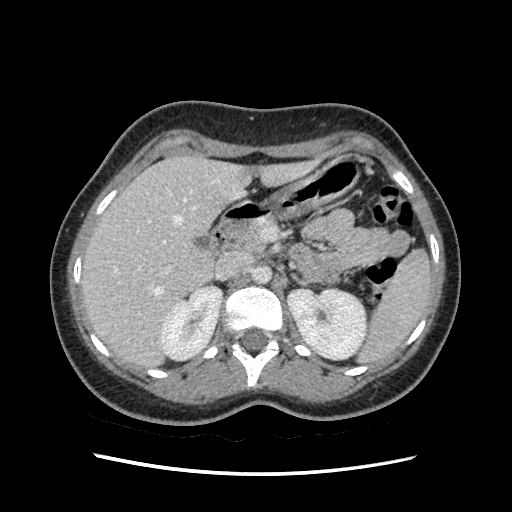
[im 145/180  soft-tissue]
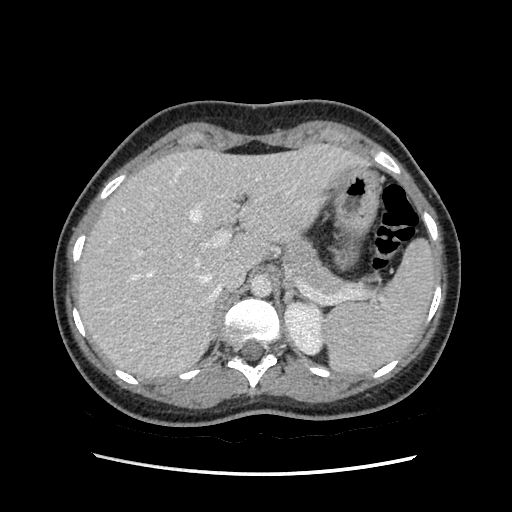
[im 156/180  soft-tissue]
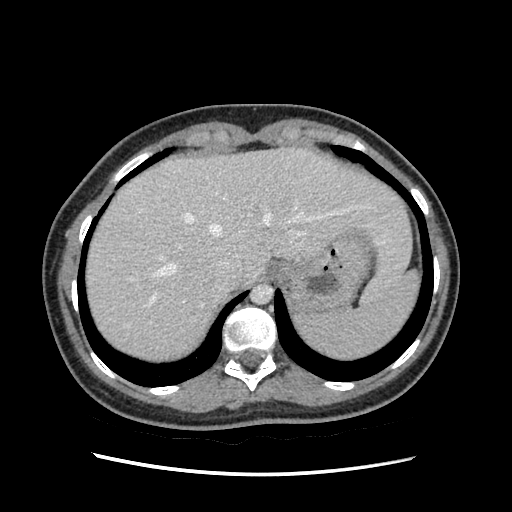
[im 168/180  soft-tissue]
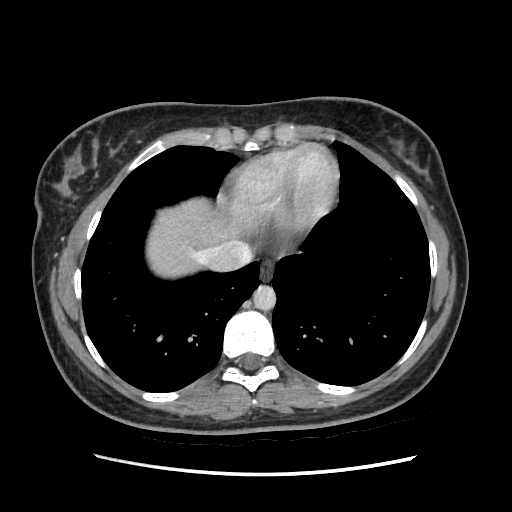

[Series 602: sag standard 2x2 · sagittal · 0.88mm/px · 3 of 181 slices shown]
[im 61/181  soft-tissue]
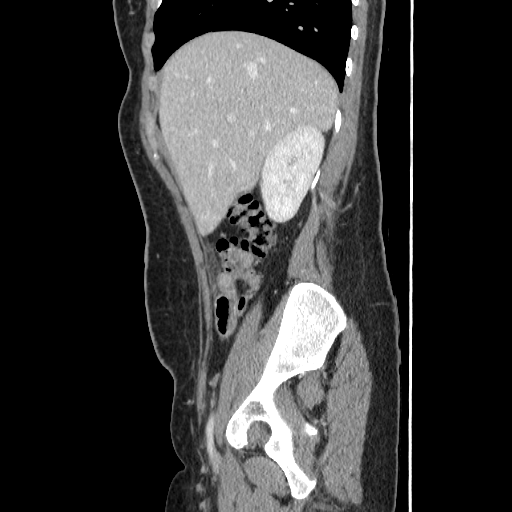
[im 81/181  soft-tissue]
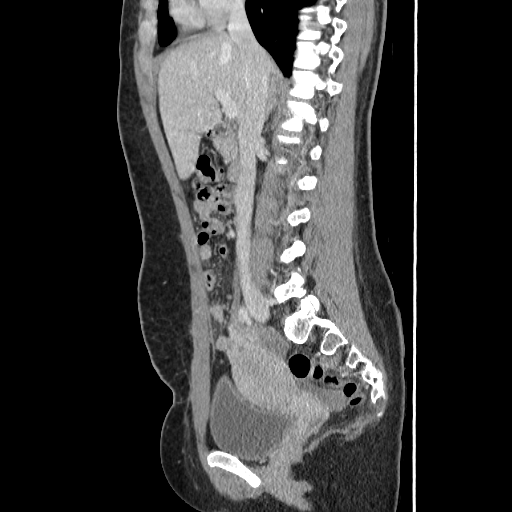
[im 101/181  soft-tissue]
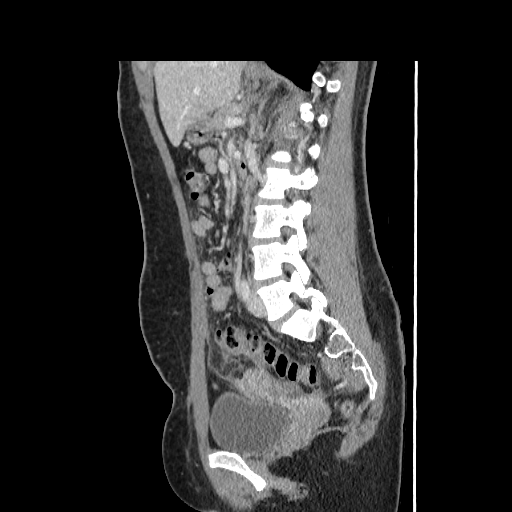

[17 of 46 positions shown; findings below may reference images not displayed]

FINDINGS: Bilateral pars defects at L5-S1. Grade 1 anterolisthesis L5-S1. There are hypertrophic changes involving the bilateral ureters. There is thickening involving the bladder wall. The appendix appears normal. Moderate fecal burden. Physiologic bilateral ovarian cysts measuring up to 1.7 cm. There is physiologic fluid within the posterior inner pelvis. There is heterogeneous appearance involving the bilateral adnexa. There is hazy stranding throughout the bilateral adnexa. The gallbladder is normal. No acute osseous abnormalities.
The liver,  spleen, pancreas, adrenal glands are unremarkable.
No hydronephrosis.
No bowel dilation.
IMPRESSION: 
IMPRESSION: Heterogeneous appearance of the bilateral adnexa with stranding worrisome for pelvic inflammatory disease. Recommend pelvic ultrasound for further evaluation.
Inflammatory changes along the bladder and ureters. Recommend correlation with urinalysis.
The appendix is normal. No bowel obstruction. The gallbladder is normal. No hydronephrosis.

## 2020-07-11 IMAGING — US US PELVIS TRANSVAGINAL
1 series · 14 of 25 positions shown · non-contrast
Comparison: none

This is a summary report. The complete report is available in the patient's medical record. If you cannot access the medical record, please contact the sending organization for a detailed fax or copy.
INDICATION: Ovarian cyst.39.8
Ultrasound demonstrated uterus that measures 9.8 x 4.4 x 5.2cm, appears normal size and shape.  Anterior myometrial surgical changes noted.  Endometrium measures 1.69cm, appears thickened and irregular.  Right ovary measures 3.4 x 2.5 x 2.0cm. Simple appearing cyst seen on the right ovary that measures 1.9 x 1.5 x 1.6cm.   Left ovary measures 2.7 x 2.7 x 2.1cm, appears normal size and shape.  Blood flow seen within both ovaries.  Small to moderate amount of free fluid seen throughout the pelvis.  Transvaginal scan performed.

[Series 1: us pelvis transvaginal · 14 of 26 slices shown]
[im 1/26]
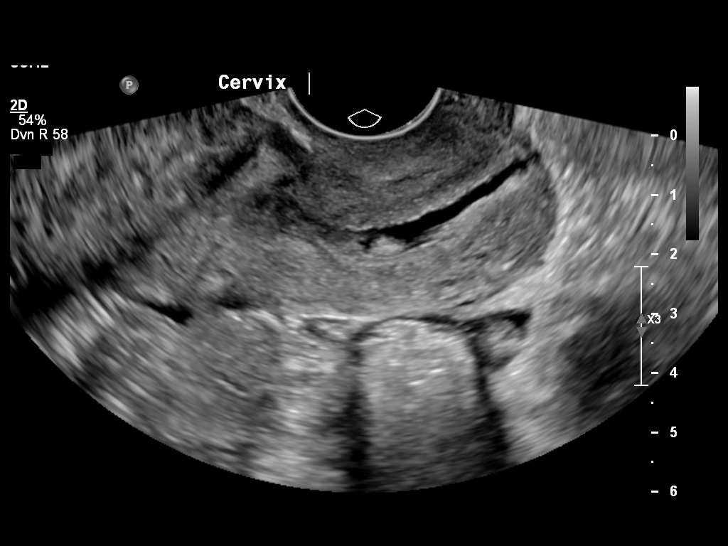
[im 3/26]
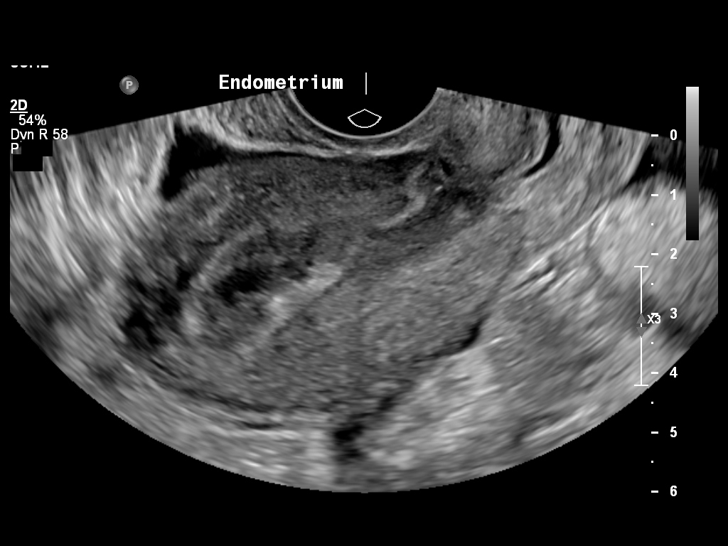
[im 5/26]
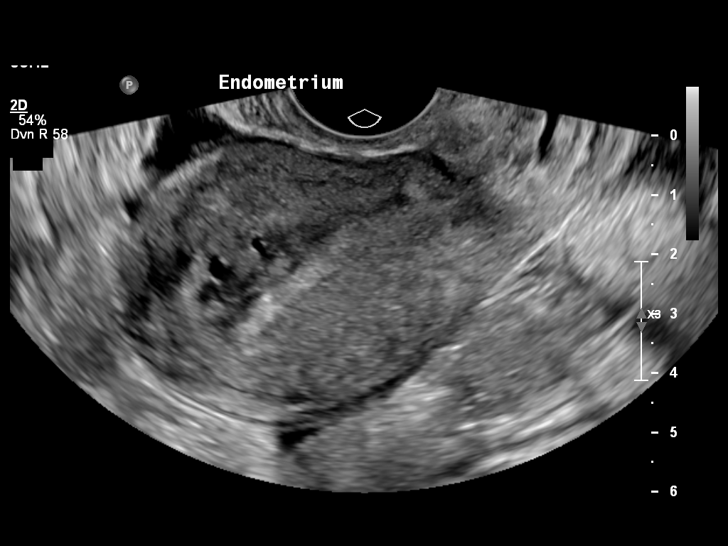
[im 7/26]
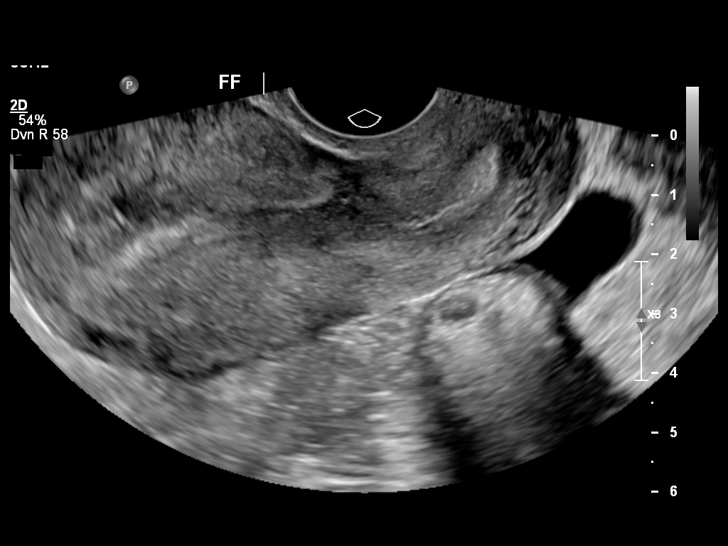
[im 9/26]
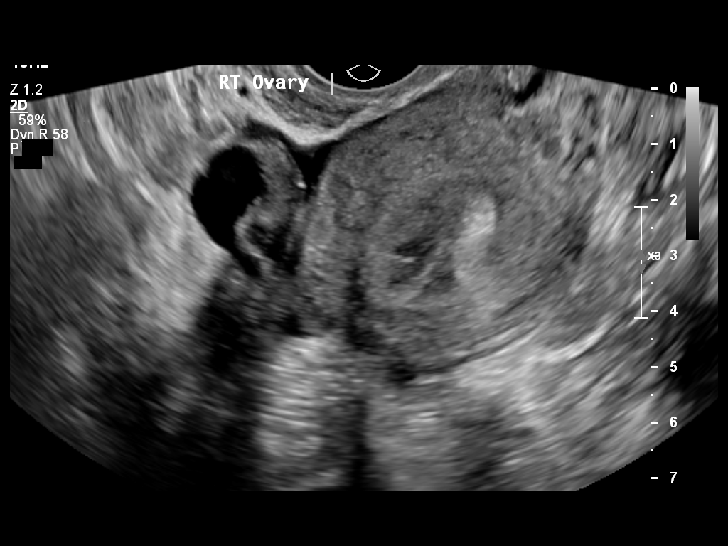
[im 10/26]
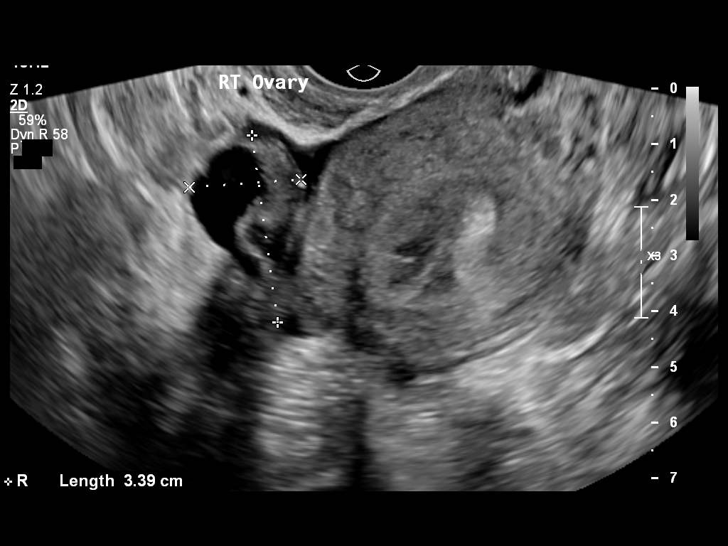
[im 12/26]
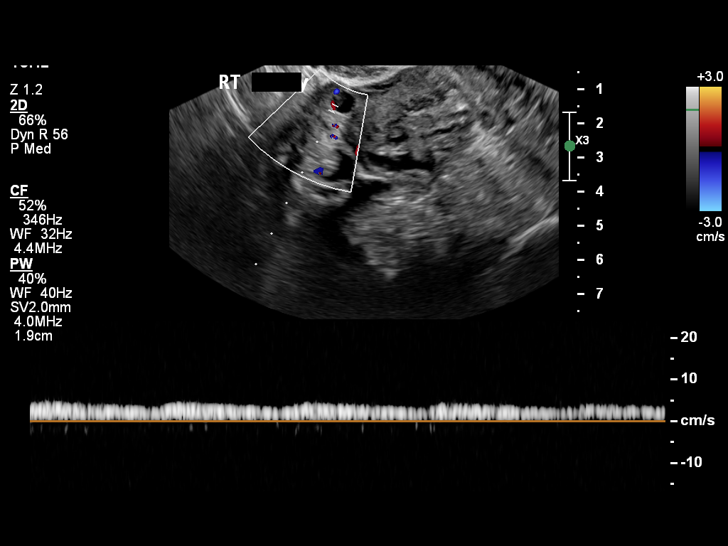
[im 14/26]
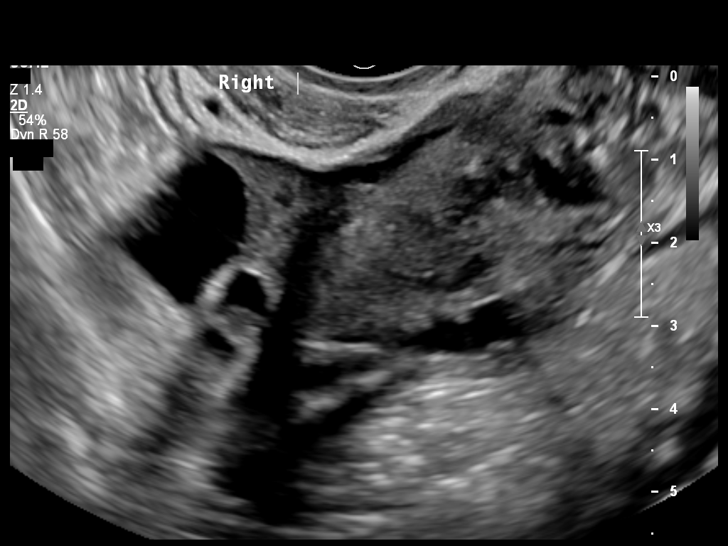
[im 16/26]
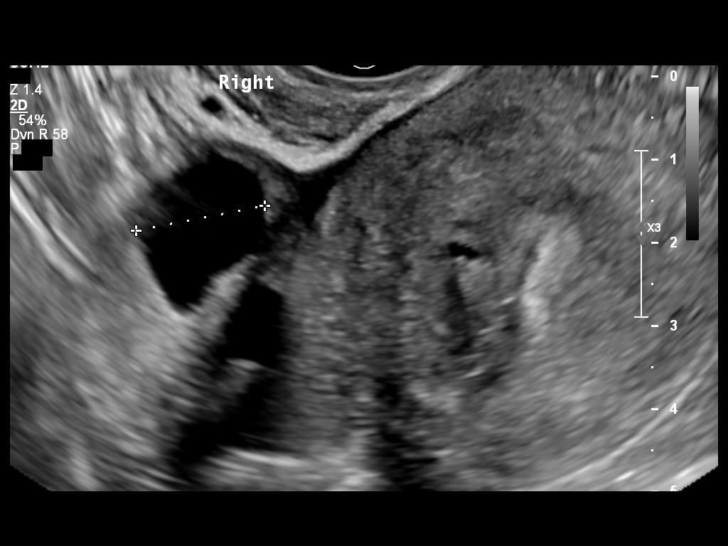
[im 17/26]
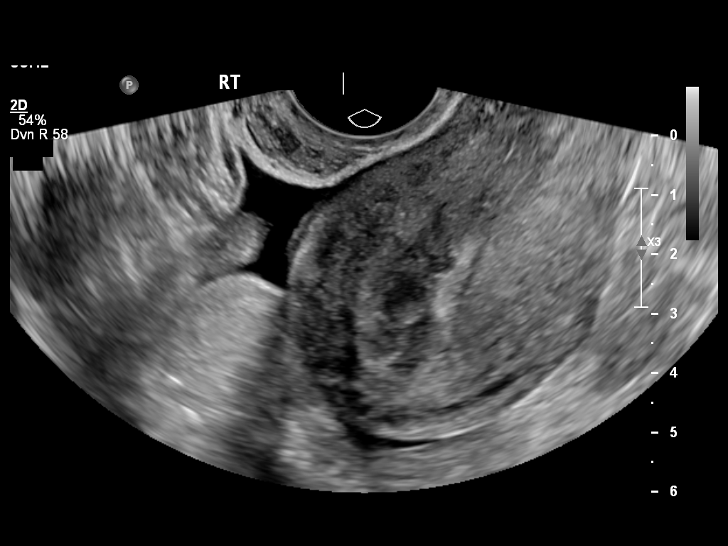
[im 19/26]
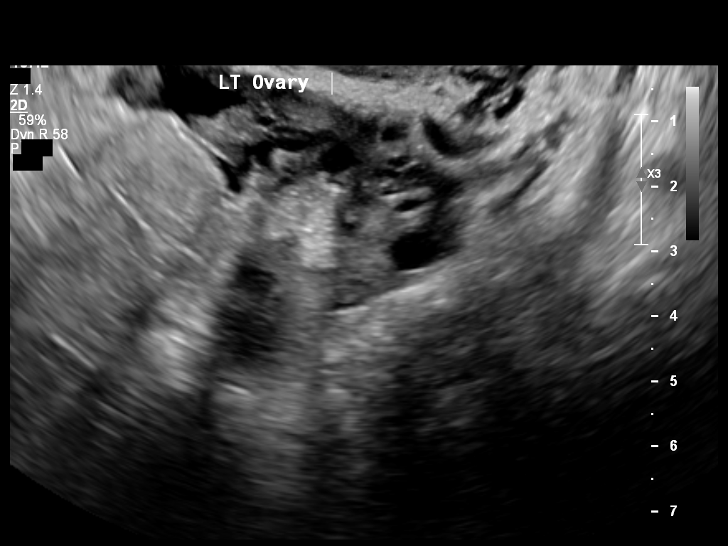
[im 21/26]
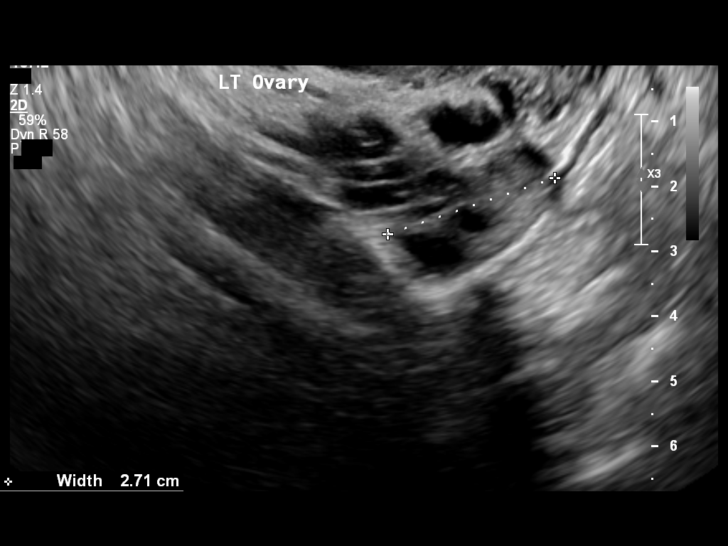
[im 23/26]
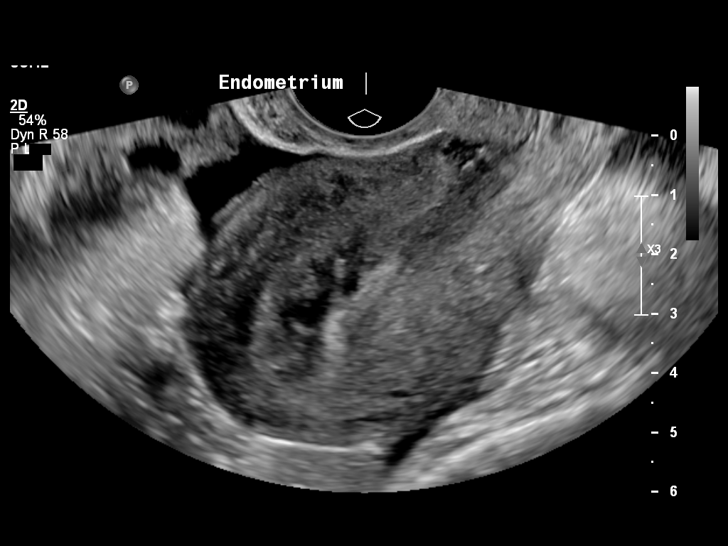
[im 26/26]
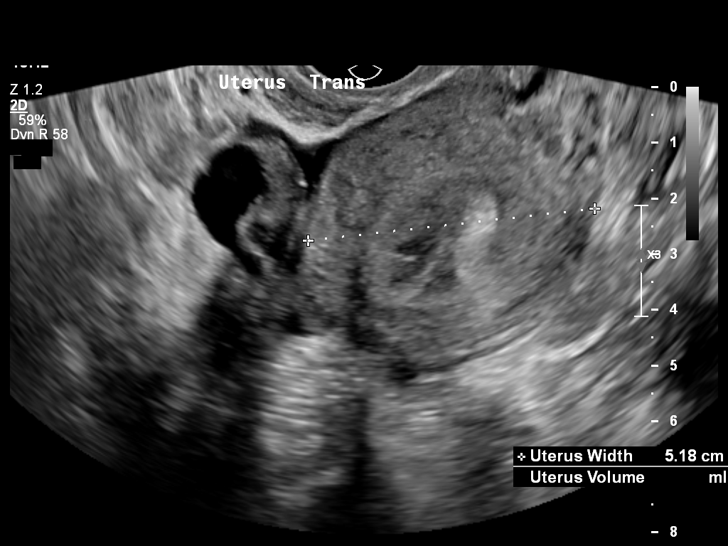

[14 of 25 positions shown; findings below may reference images not displayed]

## 2021-01-21 IMAGING — CR Chest
2 series · 2 of 2 positions shown · non-contrast
Comparison: None available.

FINAL REPORT:
INDICATION: CP

[PA]
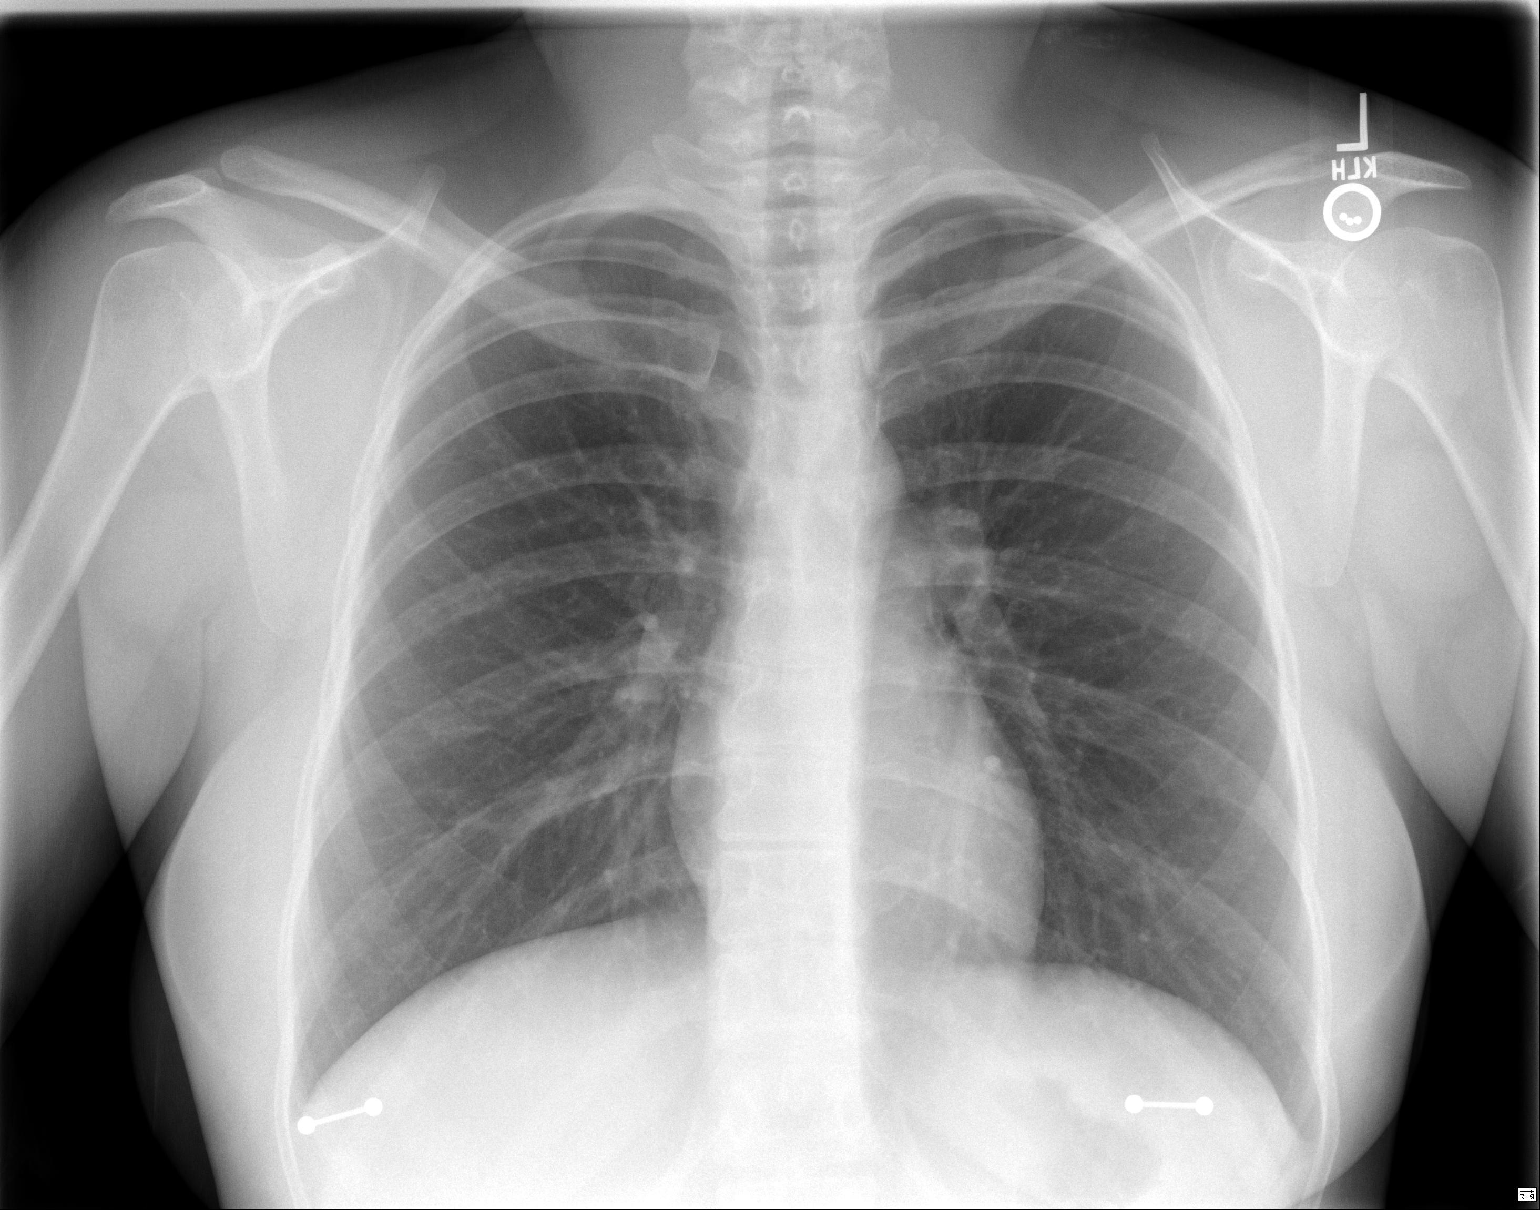

[left lateral]
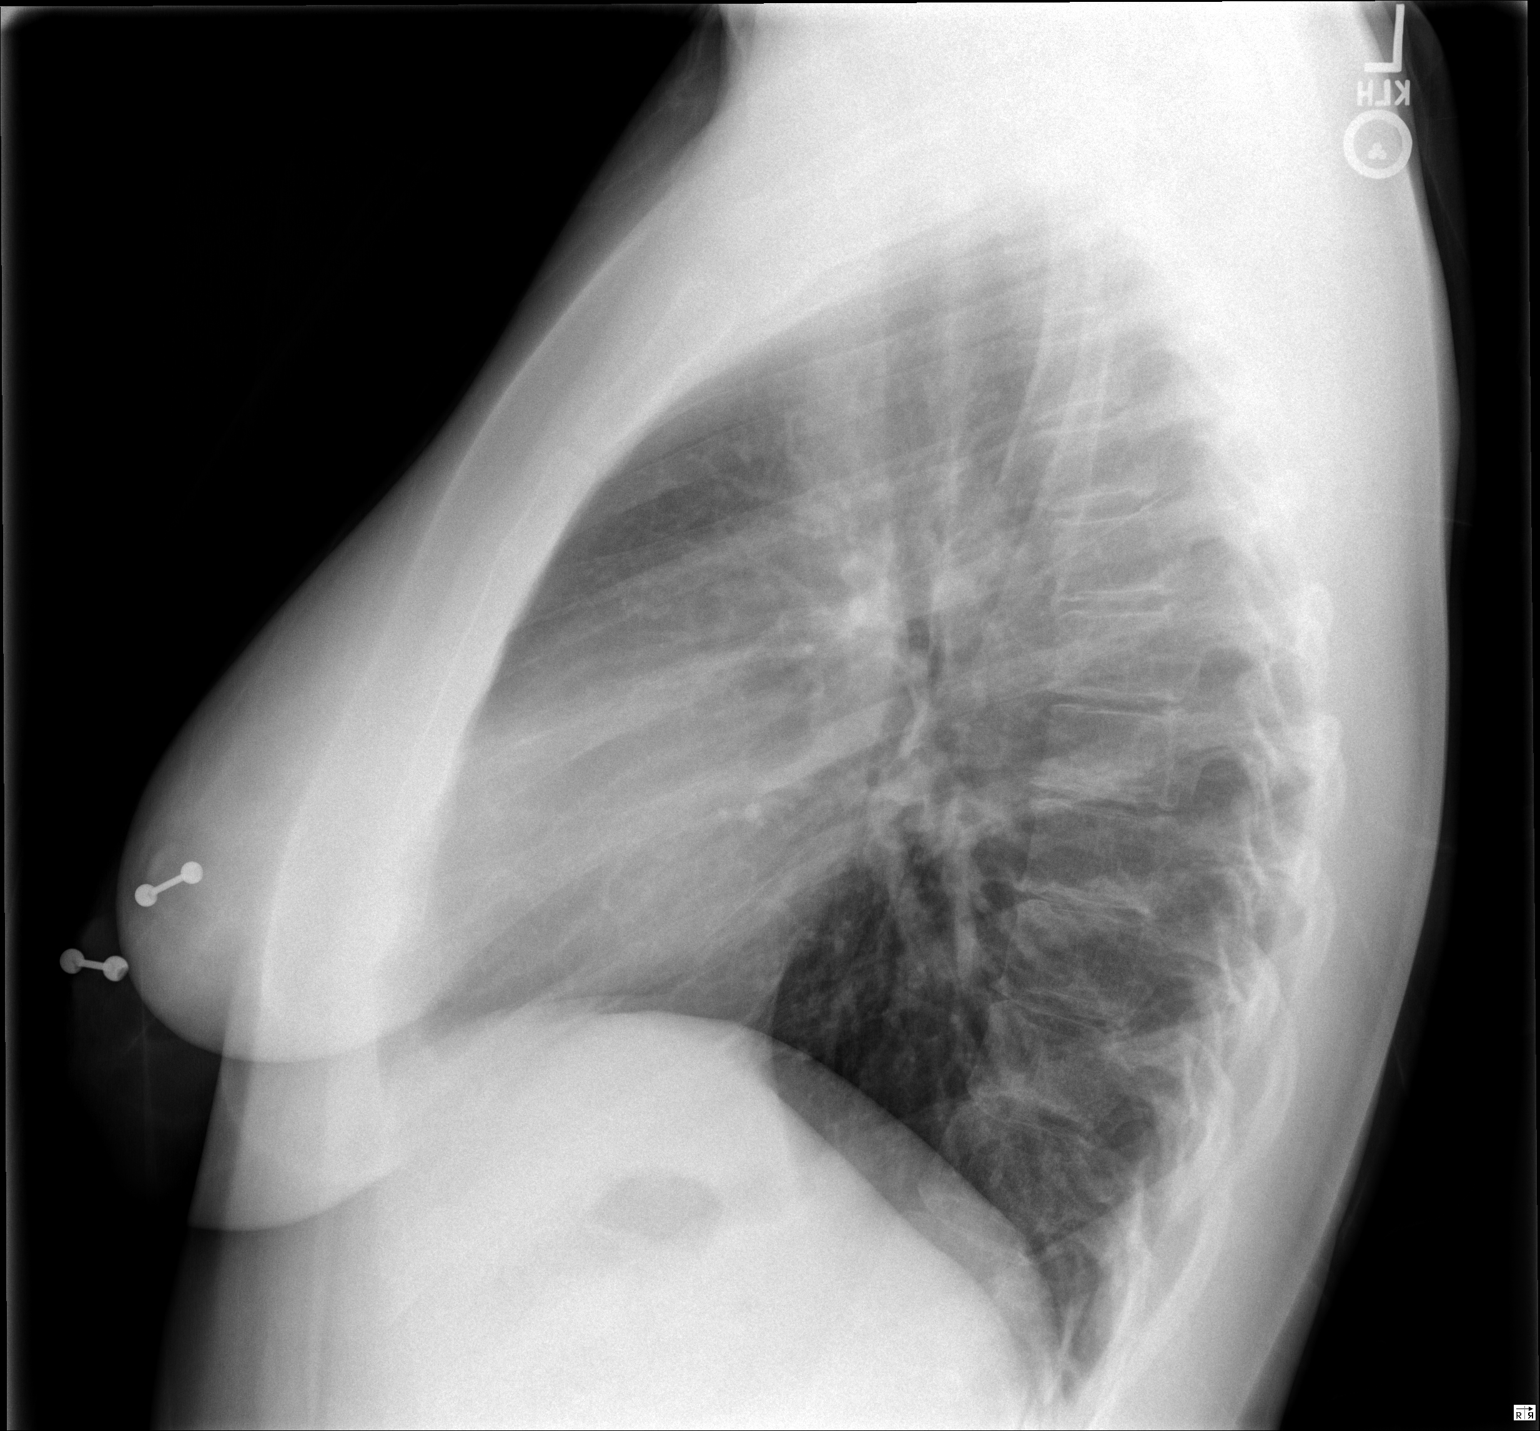

[2 of 2 positions shown; findings below may reference images not displayed]

FINDINGS: 2 views of the chest. The cardiomediastinal contours are within normal limits. No evidence of focal consolidation, pleural effusion, pulmonary edema, or pneumothorax.
IMPRESSION: 
IMPRESSION: No acute cardiopulmonary findings.
Is the patient pregnant?
No

## 2022-01-05 IMAGING — CT CT ABDOMEN PELVIS WITHOUT CONTRAST
2 of 3 series · 17 of 46 positions shown, 19 images · non-contrast
Comparison: 06/29/2020

FINAL REPORT:
EXAM: CT ABDOMEN PELVIS WITHOUT CONTRAST
INDICATION: RLQ abdominal pain (Age >= 14y)

[Series 2: renal stone · axial · 0.66mm/px · z∈[-558,-91]mm · 14 of 215 slices shown, 16 images]
[im 14/215  soft-tissue]
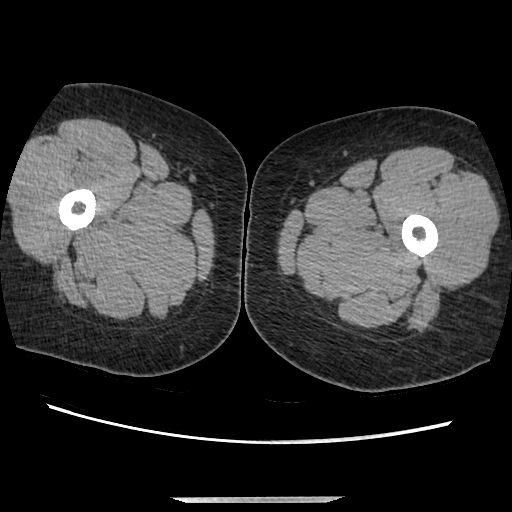
[im 14/215  bone]
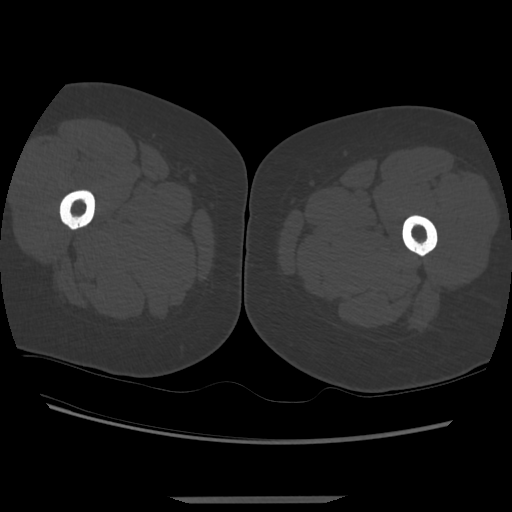
[im 28/215  soft-tissue]
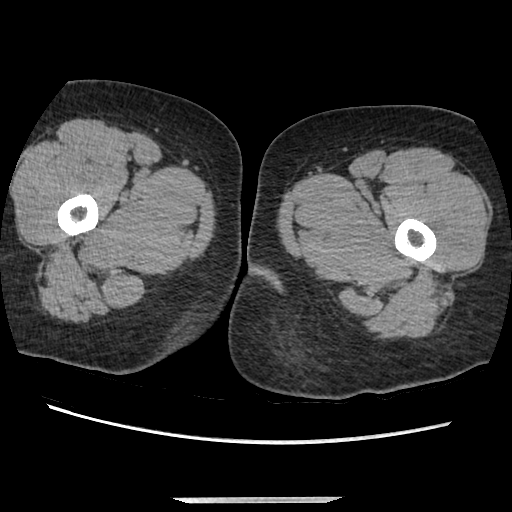
[im 42/215  soft-tissue]
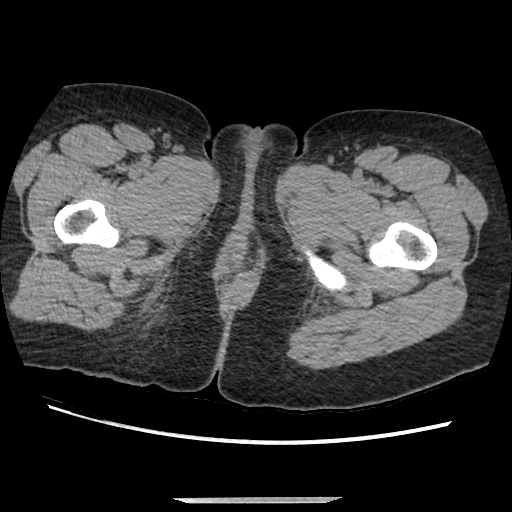
[im 56/215  soft-tissue]
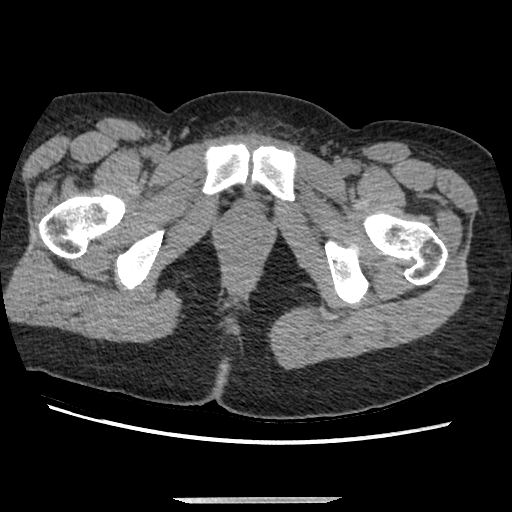
[im 70/215  soft-tissue]
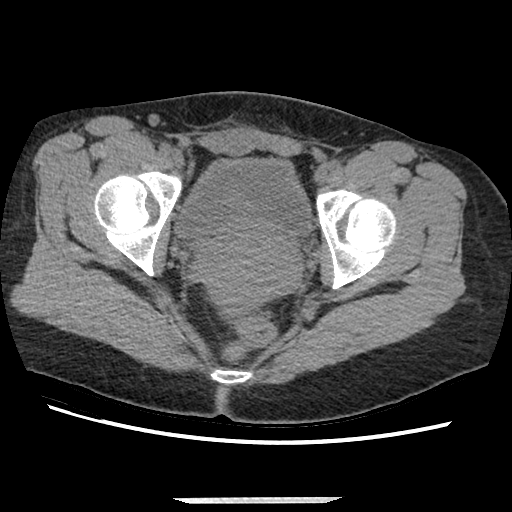
[im 83/215  soft-tissue]
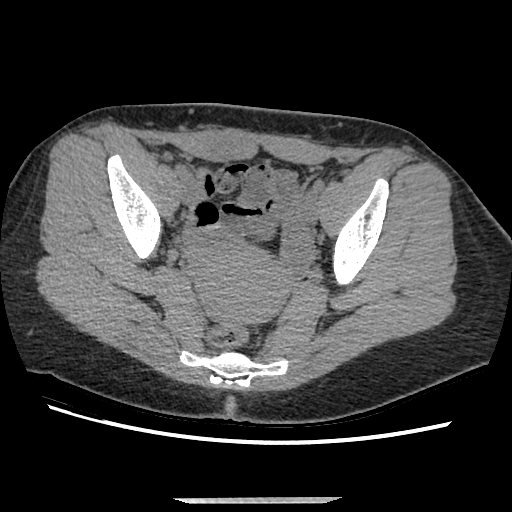
[im 97/215  soft-tissue]
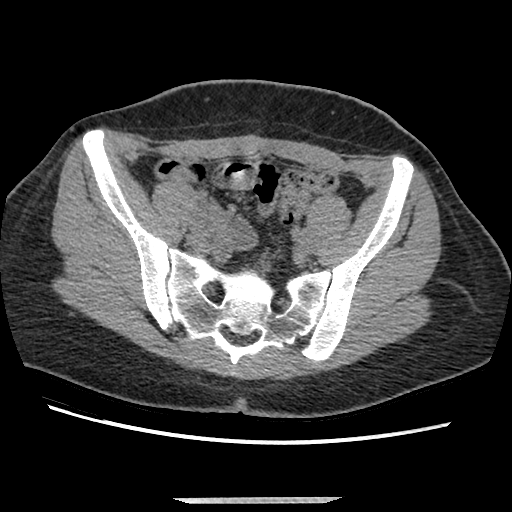
[im 118/215  soft-tissue]
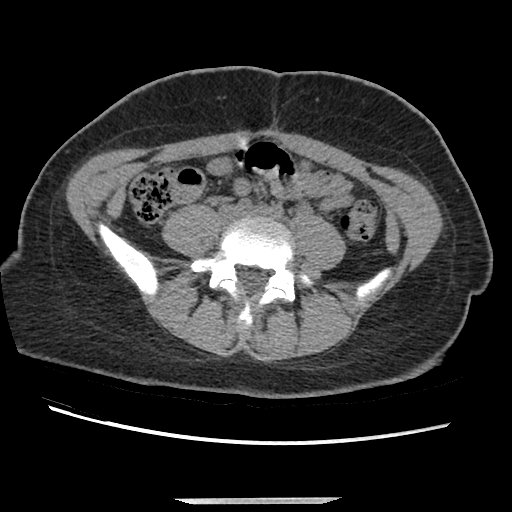
[im 132/215  soft-tissue]
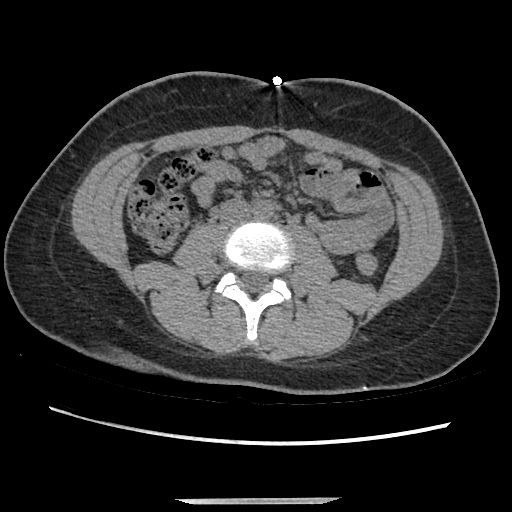
[im 132/215  bone]
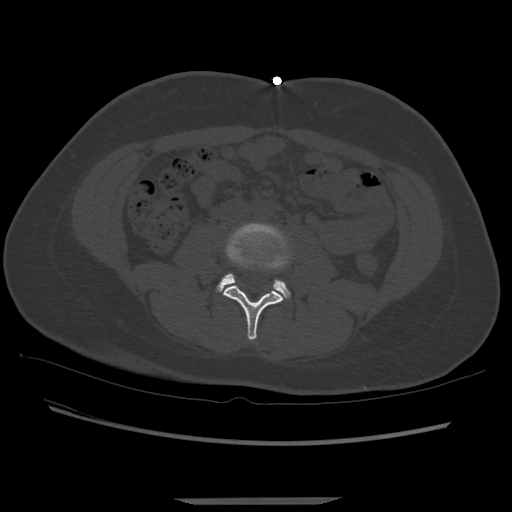
[im 145/215  soft-tissue]
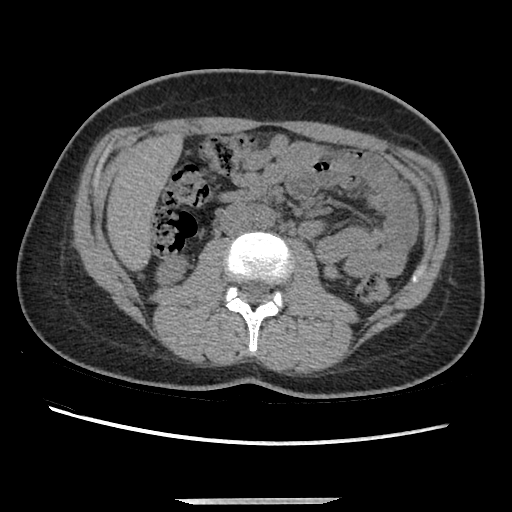
[im 159/215  soft-tissue]
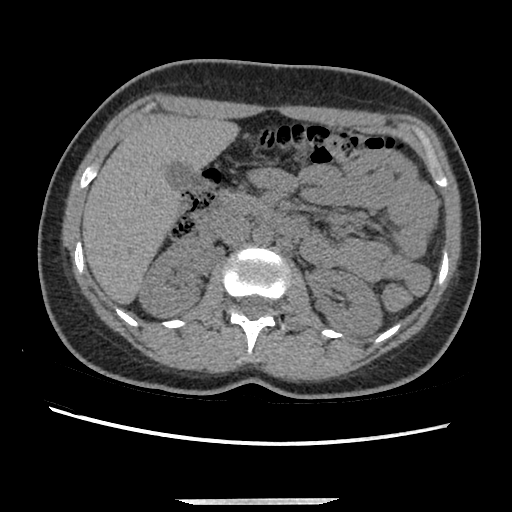
[im 173/215  soft-tissue]
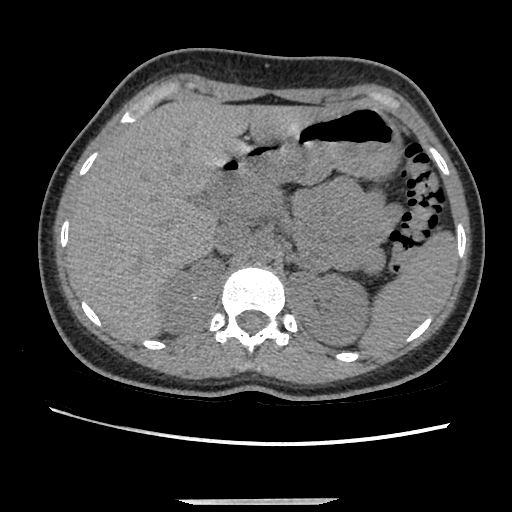
[im 187/215  soft-tissue]
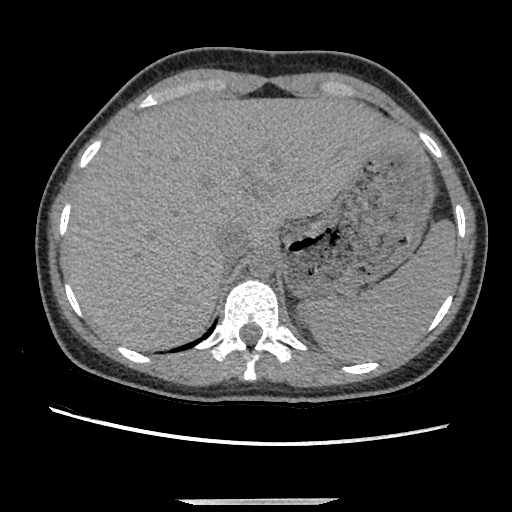
[im 201/215  soft-tissue]
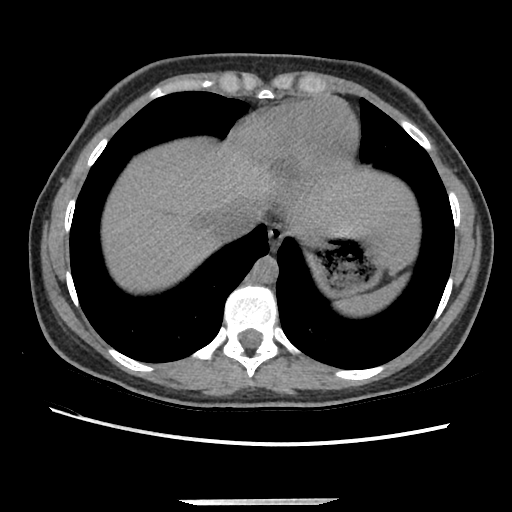

[Series 602: sag standard 2x2 · sagittal · 1.05mm/px · 3 of 171 slices shown]
[im 57/171  soft-tissue]
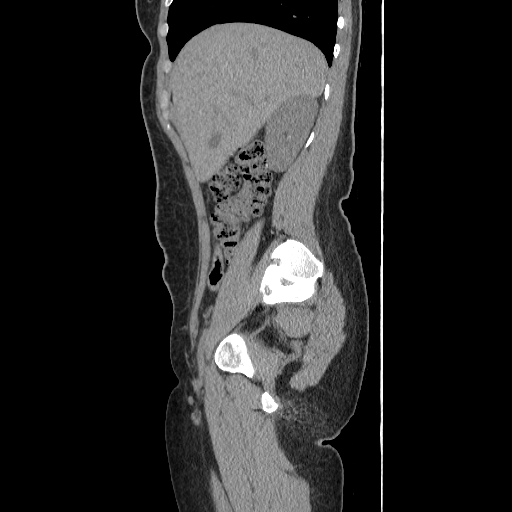
[im 76/171  soft-tissue]
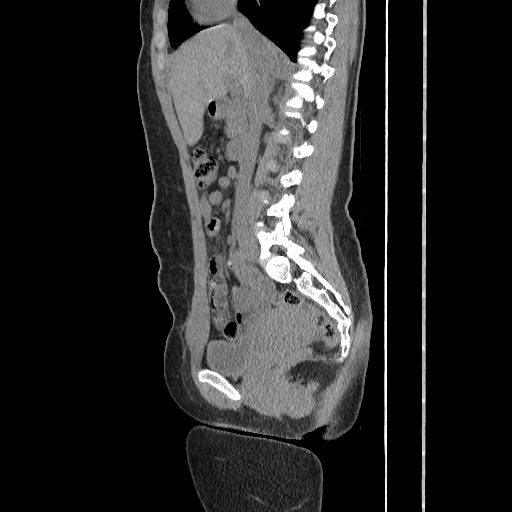
[im 95/171  soft-tissue]
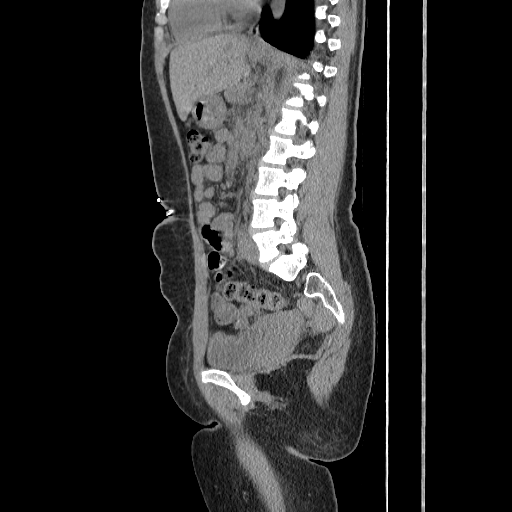

[17 of 46 positions shown; findings below may reference images not displayed]

FINDINGS: CT of the abdomen and pelvis performed with multiplanar reformations. No IV contrast was administered which degrades solid organ and vascular evaluation
VISUALIZED LOWER CHEST: Within normal limits.
LIVER: Within normal limits
GALLBLADDER: Within normal limits.
BILIARY: Grossly within normal limits.
PANCREAS: Within normal limits.
SPLEEN: Within normal limits.
ADRENAL GLANDS: No adrenal nodules.
KIDNEYS AND URETERS: Few punctate nonobstructing renal calculi bilaterally. Question mild right hydronephrosis.
URINARY BLADDER: Within normal limits.
REPRODUCTIVE: Grossly within normal limits.
PERITONEUM: No pathologic free intraperitoneal fluid.
BOWEL: No wall thickening or obstruction. Normal appendix.
VASCULATURE: Normal caliber aorta.
LYMPH NODES: No lymphadenopathy.
ABDOMINAL WALL: No bowel containing hernia.
MUSCULOSKELETAL: Bilateral L5 pars defects without listhesis
IMPRESSION: *  Question mild right hydronephrosis which could reflect a recently passed calculus or could possibly be seen with infection. Please correlate.
*  Punctate nephrolithiasis.
*  Bilateral L5 pars defects without listhesis.
Is the patient pregnant?
No

## 2022-02-21 IMAGING — CR XR CHEST 2 VIEWS
1 series · 2 of 2 positions shown · non-contrast
Comparison: 01/21/2021

Pt couldn't take piercing's out
FINAL REPORT:
XR CHEST 2 VIEWS
INDICATION / CLINICAL INFORMATION:
cough

[Series 1688: PA · 0.19mm/px · 2 of 2 slices shown]
[im 1/2]
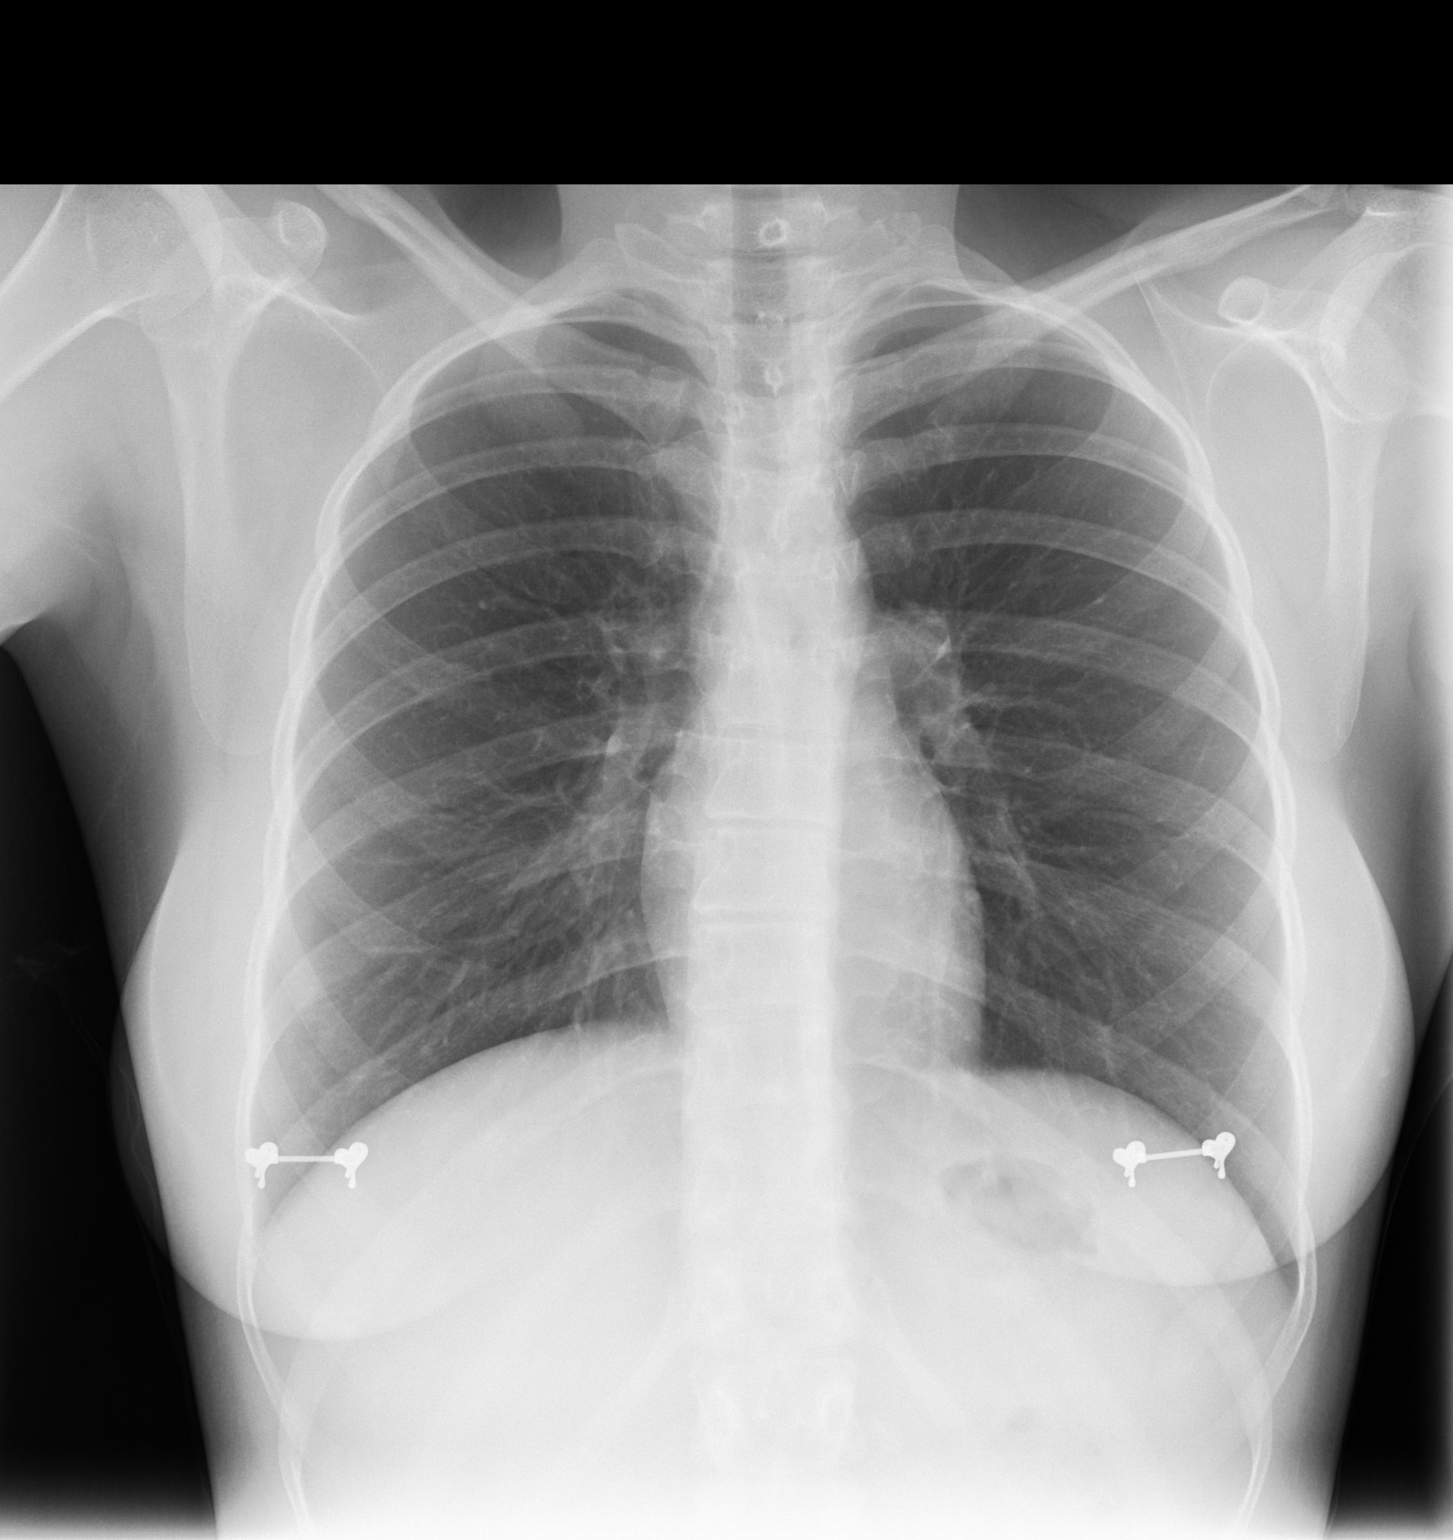
[im 2/2]
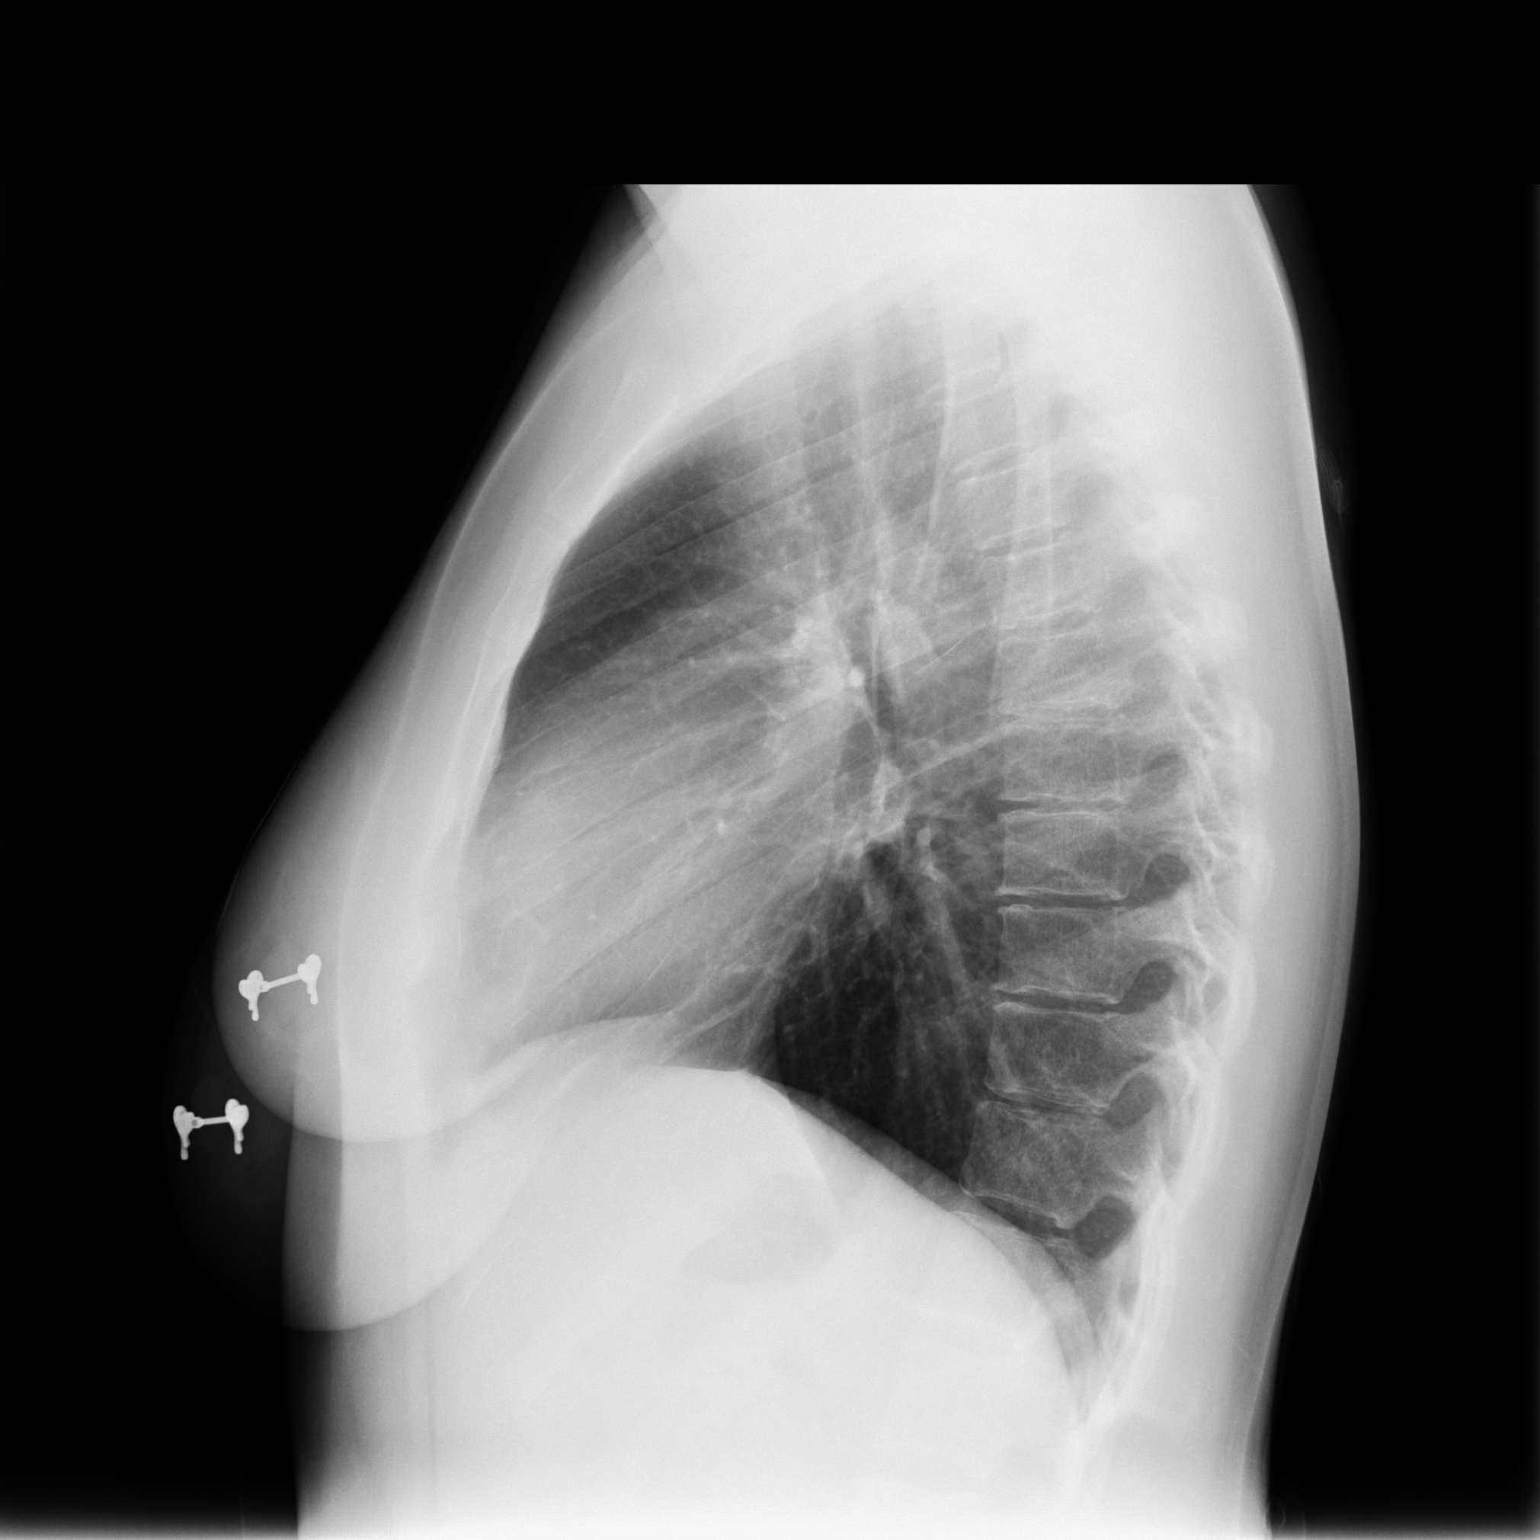

[2 of 2 positions shown; findings below may reference images not displayed]

FINDINGS: SUPPORT DEVICES: None.
HEART /PULMONARY VASCULATURE: No significant abnormality.
LUNGS / PLEURA: No acute pulmonary or pleural abnormality. No pneumothorax.
IMPRESSION: 1. No acute cardiopulmonary abnormality.
Is the patient pregnant?
No

## 2022-04-06 IMAGING — CT CT ABDOMEN PELVIS WITH CONTRAST
2 of 3 series · 16 of 46 positions shown, 18 images · IV contrast (isovue)
Comparison: Abdominal CT of 01/05/2022

FINAL REPORT:
EXAM: CT ABDOMEN AND PELVIS WITH CONTRAST
HISTORY: rlq poss incarcerated hernia
TECHNIQUE: Contiguous axial images were obtained through the abdomen and pelvis after the uneventful administration of intravenous contrast 100 cc ISOVUE 370. Sagittal and coronal reformatted images were generated.
All CT scans at this facility use dose modulation and/or weight based dosing when appropriate to reduce radiation dose to as low as reasonably achievable.
FINDINGS/OBSERVATIONS:
Visualized lower thorax: The lung bases are clear.  There is no parenchymal mass or pleural effusion.
Liver: The liver is normal in size and attenuation. There are no focal hepatic lesions.
Gallbladder and biliary system: The gallbladder and biliary system have normal appearance.
Pancreas: The pancreas is normal in appearance with no focal lesion.  Pancreatic duct has a normal appearance.
Spleen: The spleen is normal in size and attenuation.
Kidneys: Both kidneys are normal in size and attenuation. There is mild fullness of the right renal collecting system. There is no obstructing renal stone. The left kidney appears unremarkable.
Adrenal glands: Both adrenal glands are normal in size.
Gastrointestinal tract: The bowel is normal in caliber. The appendix is normal. There is no evidence of obstruction or pneumoperitoneum.
Vasculature: The abdominal aorta is normal in caliber.
Lymph nodes: There is no abdominal or retroperitoneal adenopathy.  The mesentery has a normal appearance.
Pelvic structures: Moderate bladder wall thickening is present. Correlate urinalysis to exclude cystitis..  There is no pelvic lymphadenopathy.  There is no free fluid within the pelvis.
Body wall and musculoskeletal: Radiodensity overlies the skin along the right flank. There is no evidence of hernia. There is no acute osseous abnormality.

[Series 2: abdomen/pel with · axial · 0.66mm/px · z∈[-476,-70]mm · 13 of 186 slices shown, 15 images]
[im 12/186  soft-tissue]
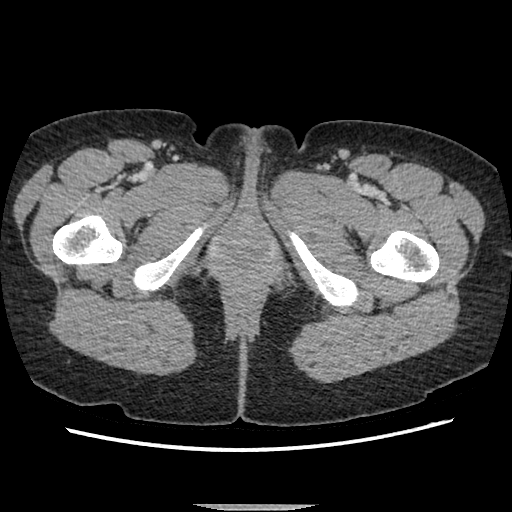
[im 12/186  bone]
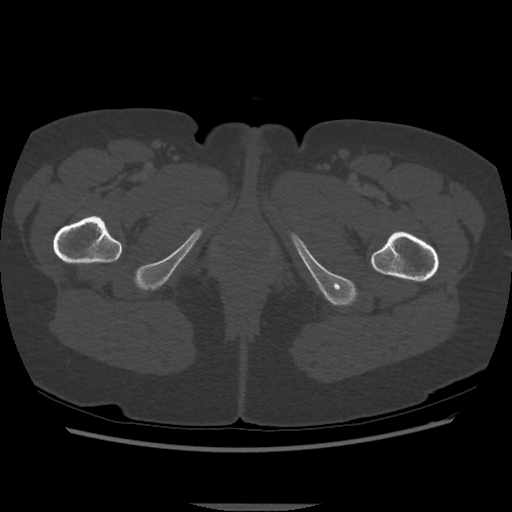
[im 24/186  soft-tissue]
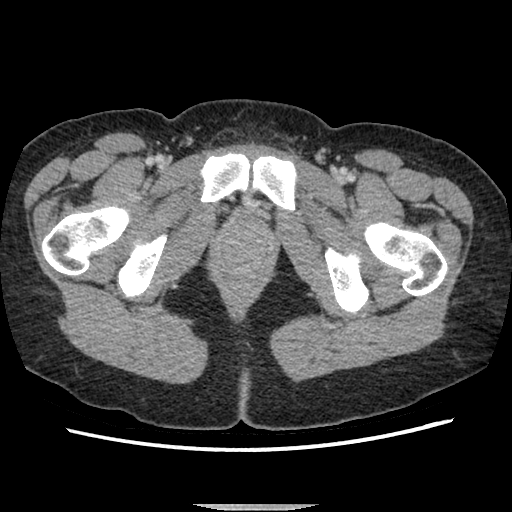
[im 36/186  soft-tissue]
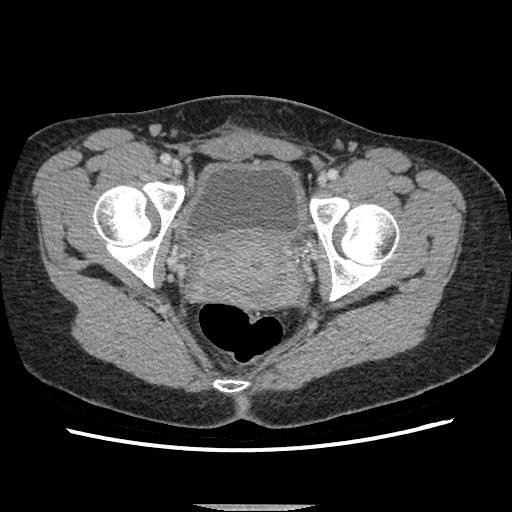
[im 54/186  soft-tissue]
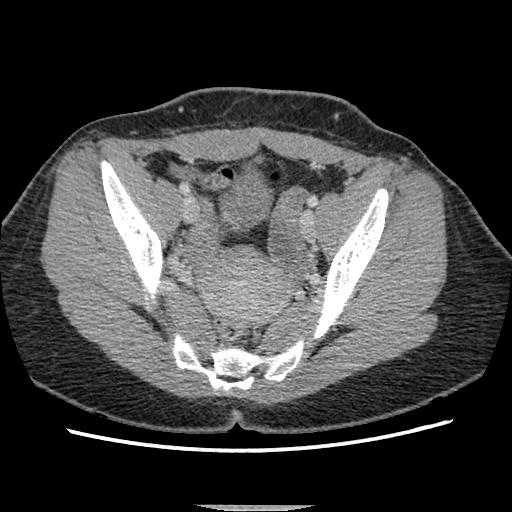
[im 66/186  soft-tissue]
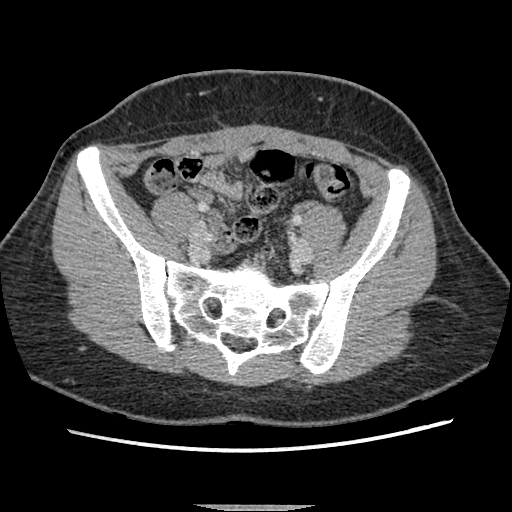
[im 78/186  soft-tissue]
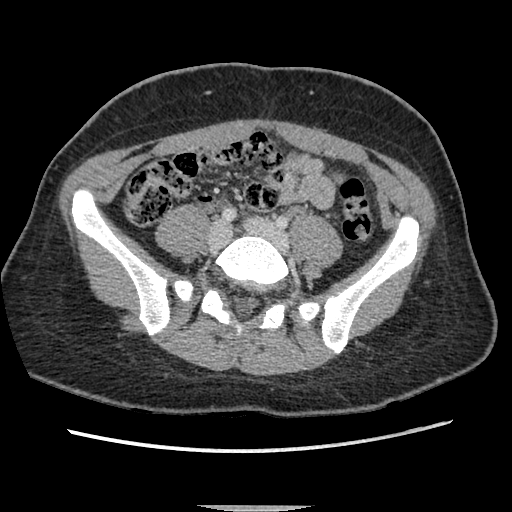
[im 96/186  soft-tissue]
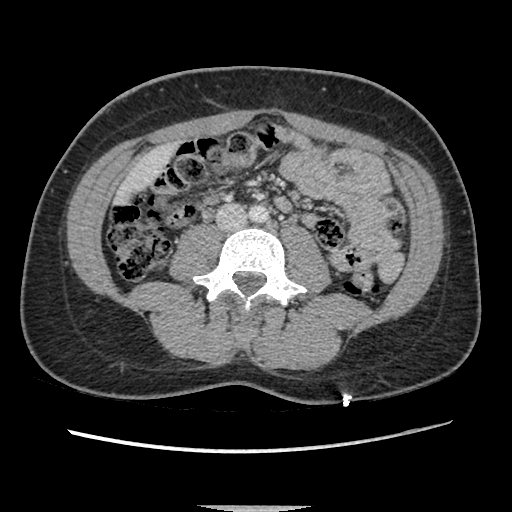
[im 108/186  soft-tissue]
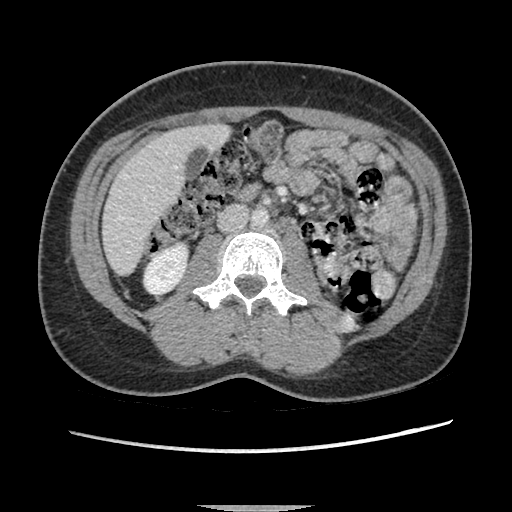
[im 120/186  soft-tissue]
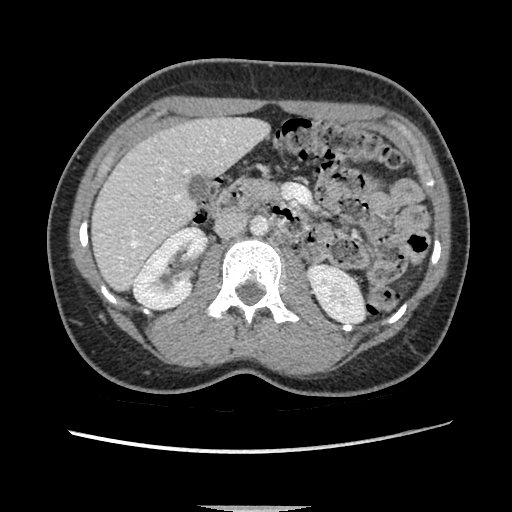
[im 120/186  bone]
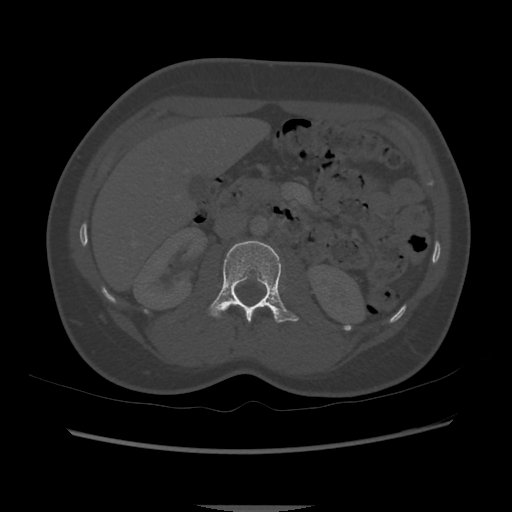
[im 132/186  soft-tissue]
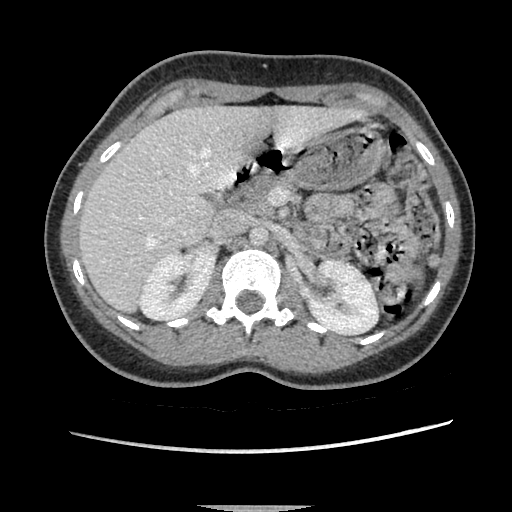
[im 150/186  soft-tissue]
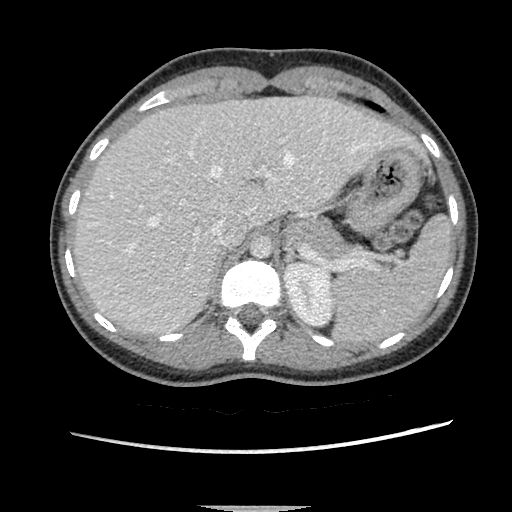
[im 162/186  soft-tissue]
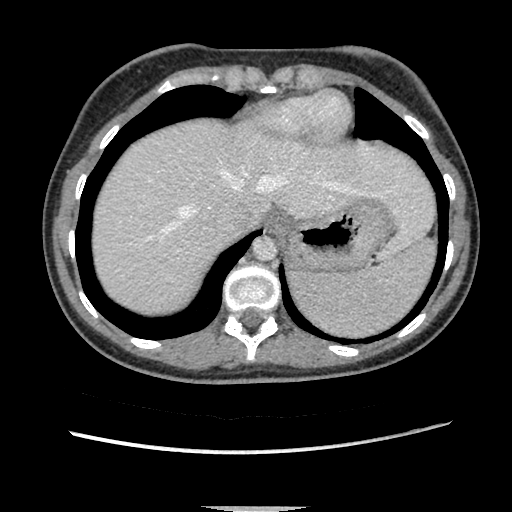
[im 174/186  soft-tissue]
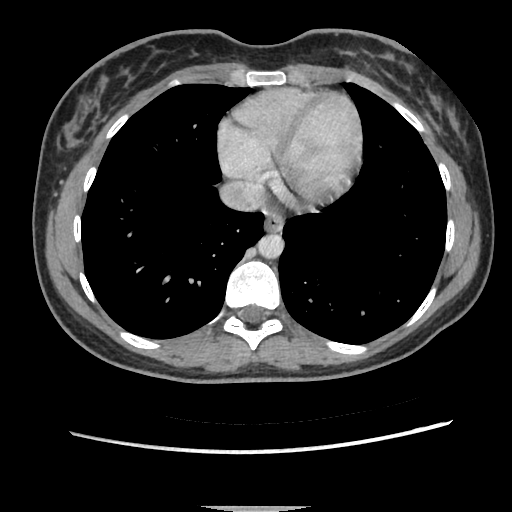

[Series 602: sag standard 2x2 · sagittal · 0.91mm/px · 3 of 171 slices shown]
[im 57/171  soft-tissue]
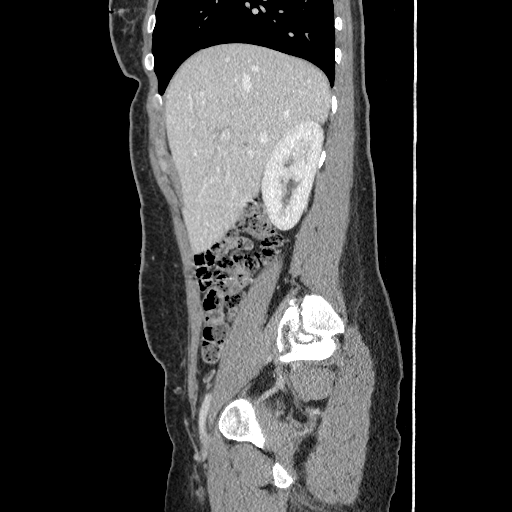
[im 76/171  soft-tissue]
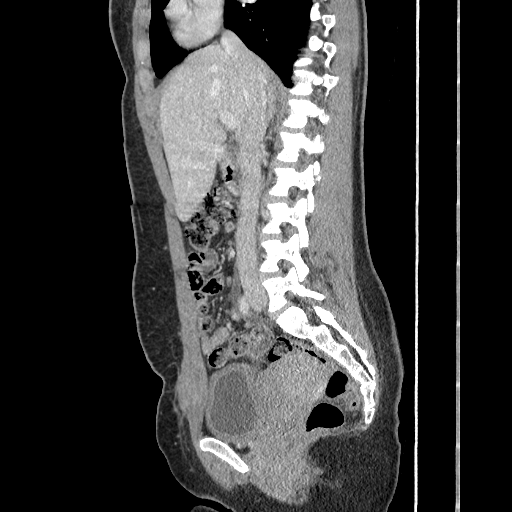
[im 95/171  soft-tissue]
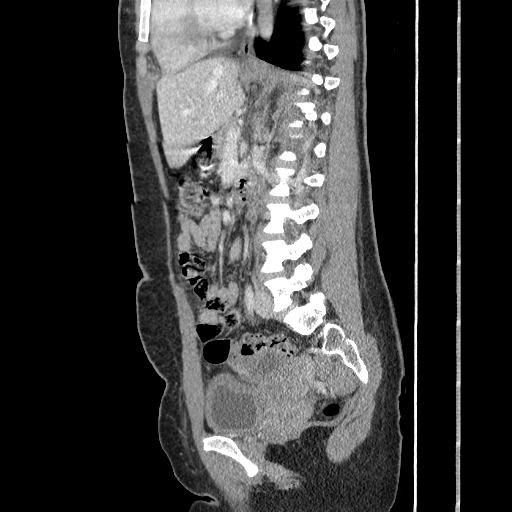

[16 of 46 positions shown; findings below may reference images not displayed]

IMPRESSION: 1.  Moderate bladder wall thickening. Correlate with urinalysis to exclude cystitis.
2.  Normal appendix.
3.  Additional findings as described above.

## 2022-08-10 IMAGING — CT CT ABDOMEN PELVIS WITHOUT CONTRAST
2 of 3 series · 16 of 46 positions shown, 18 images · non-contrast
Comparison: CT 04/06/2022.

FINAL REPORT:
CT ABDOMEN PELVIS WITHOUT CONTRAST
TECHNIQUE: CT images through the abdomen and pelvis were obtained without intravenous contrast.
All CT scans at this facility use iterative reconstruction technique, automated exposure control, and/or weight based dosing when appropriate to reduce radiation dose.
CLINICAL INDICATION:  Abdominal pain, post-op.

[Series 2: abd/pel · axial · 0.77mm/px · z∈[-444,-39]mm · 13 of 187 slices shown, 15 images]
[im 13/187  soft-tissue]
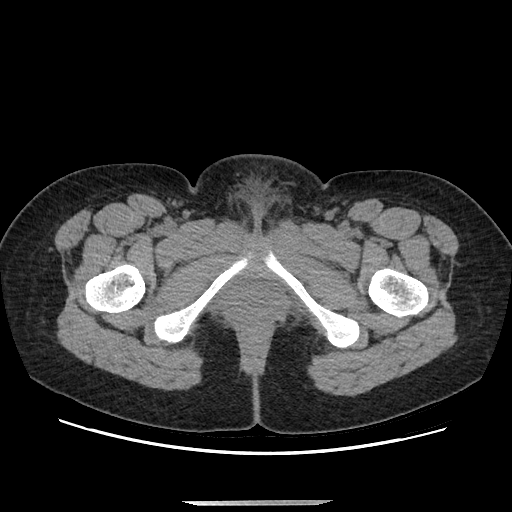
[im 13/187  bone]
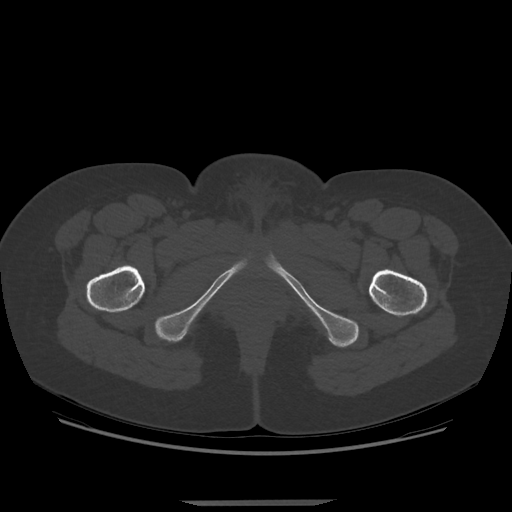
[im 25/187  soft-tissue]
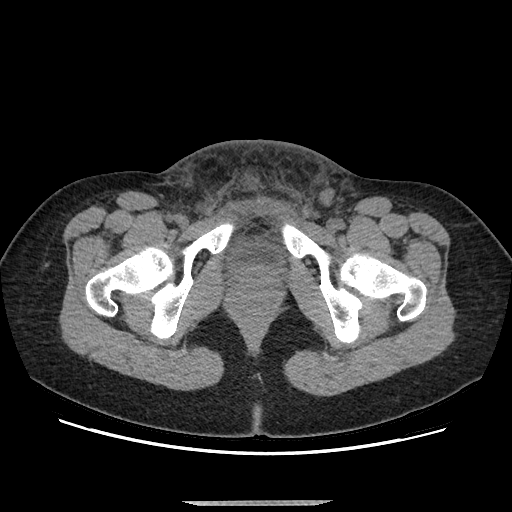
[im 37/187  soft-tissue]
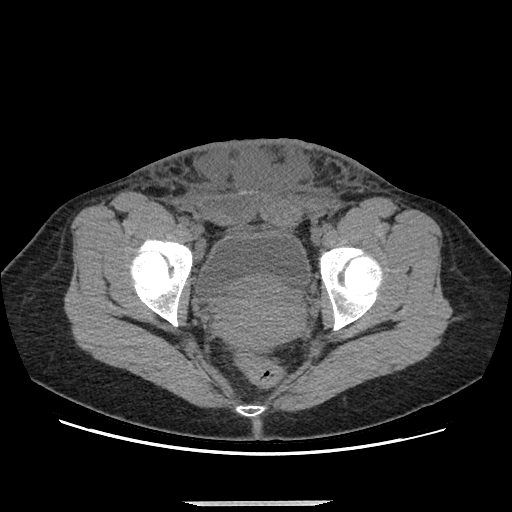
[im 55/187  soft-tissue]
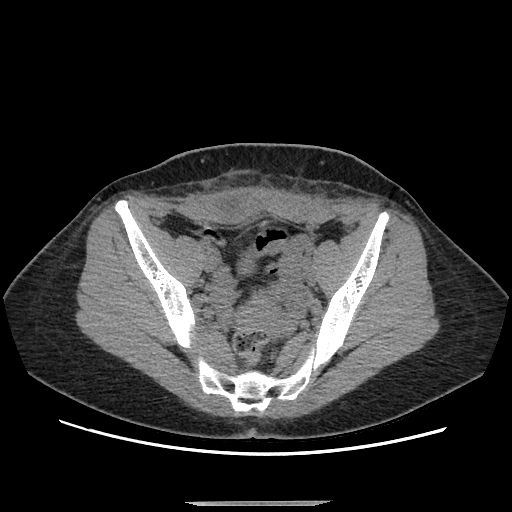
[im 67/187  soft-tissue]
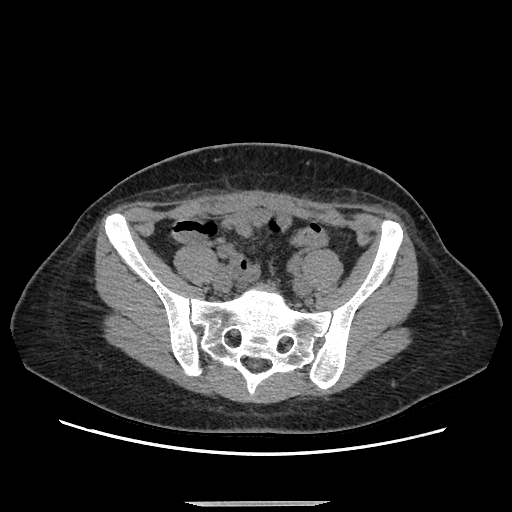
[im 79/187  soft-tissue]
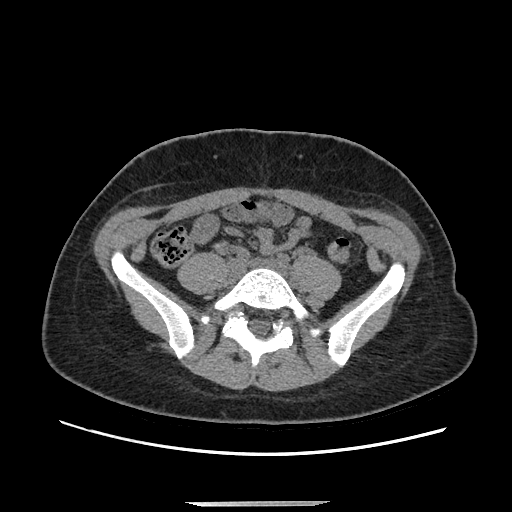
[im 97/187  soft-tissue]
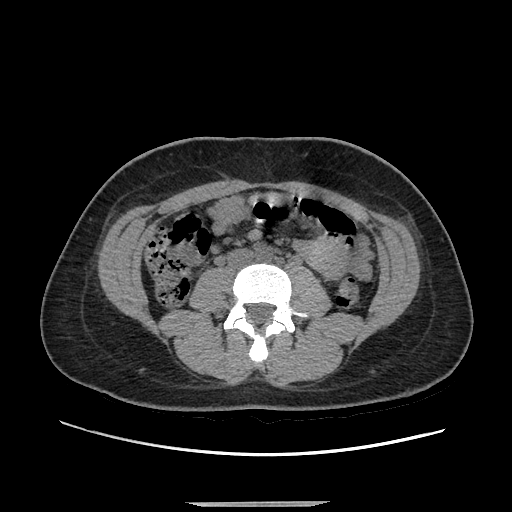
[im 109/187  soft-tissue]
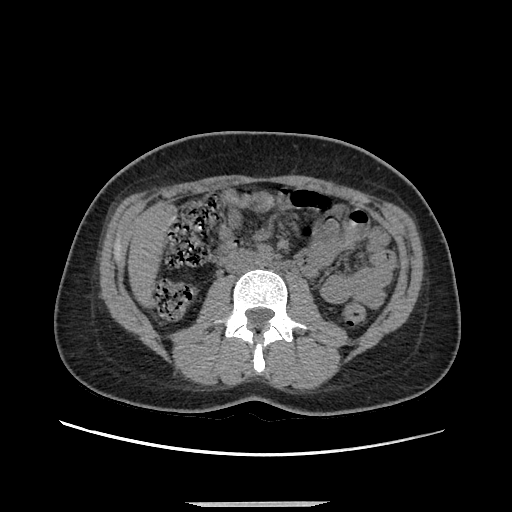
[im 121/187  soft-tissue]
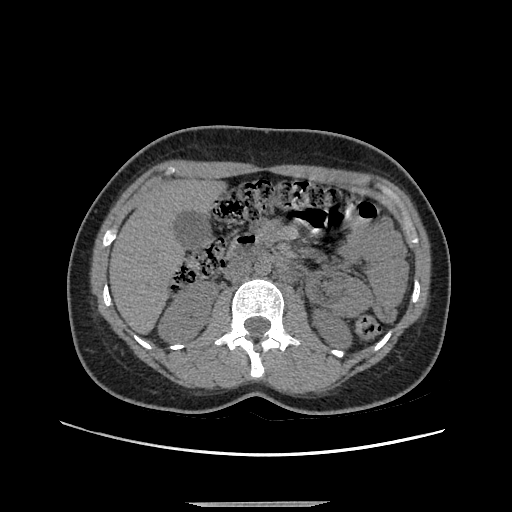
[im 121/187  bone]
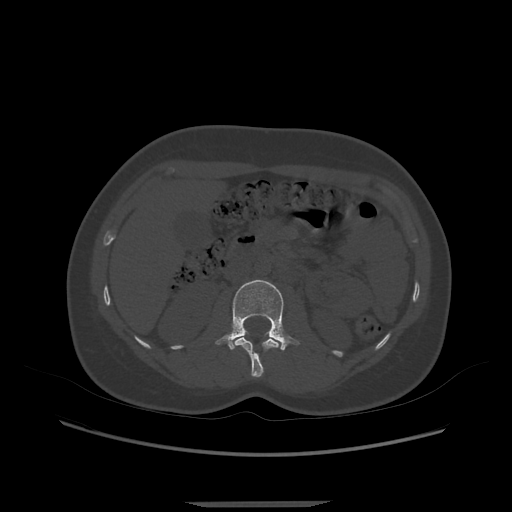
[im 133/187  soft-tissue]
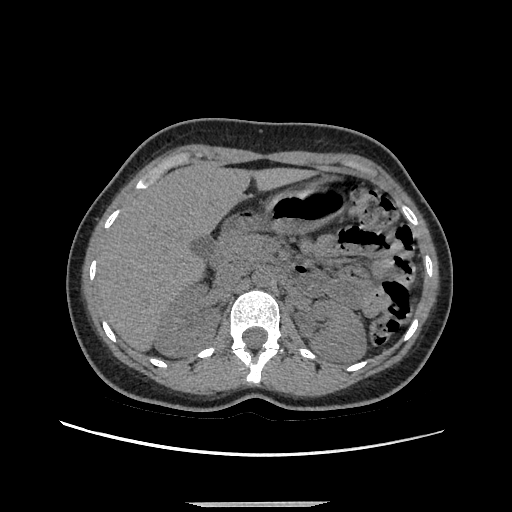
[im 151/187  soft-tissue]
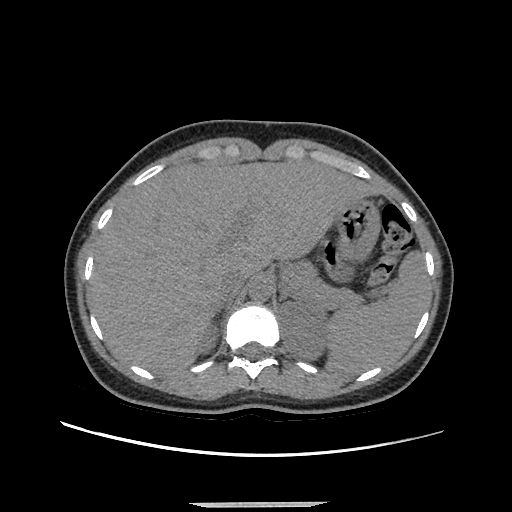
[im 163/187  soft-tissue]
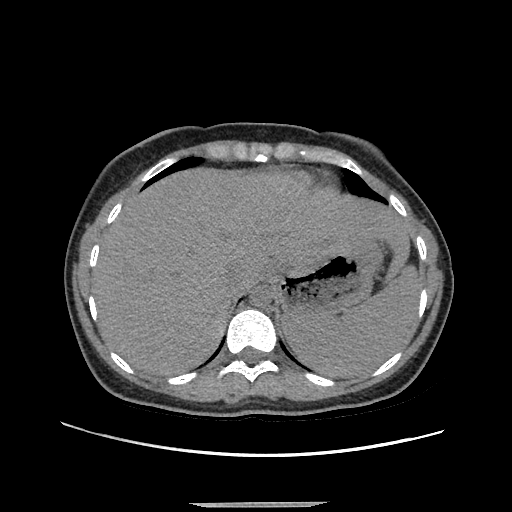
[im 175/187  soft-tissue]
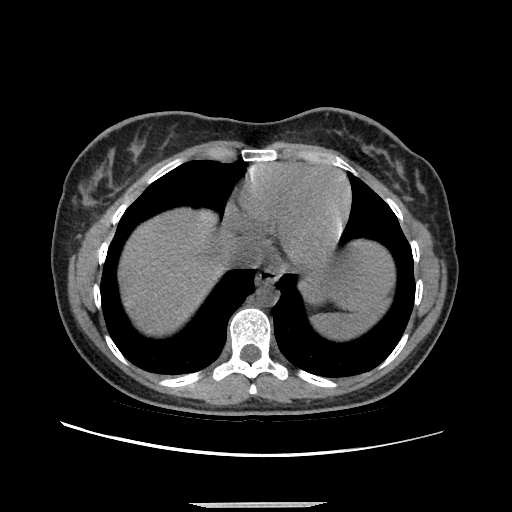

[Series 602: sag standard 2x2 · sagittal · 0.91mm/px · 3 of 199 slices shown]
[im 67/199  soft-tissue]
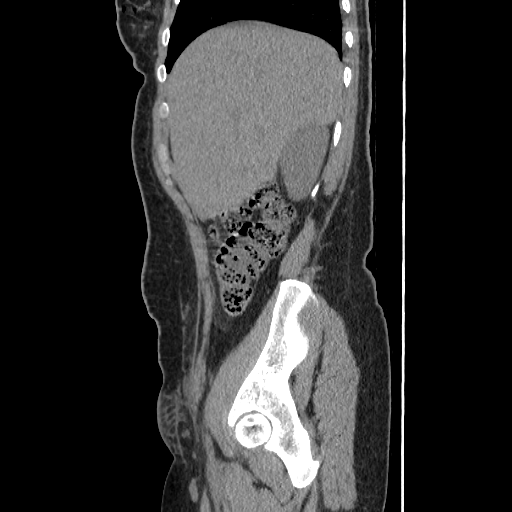
[im 89/199  soft-tissue]
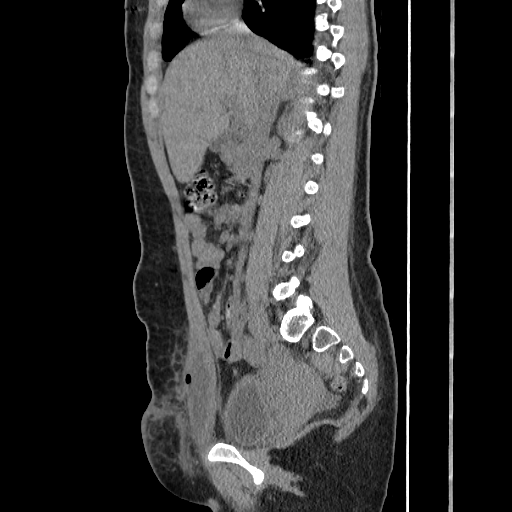
[im 111/199  soft-tissue]
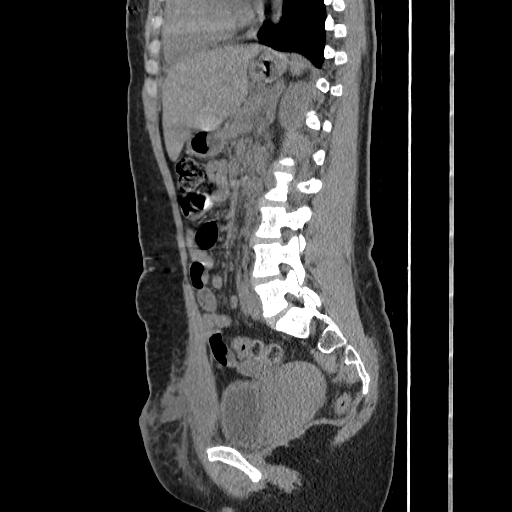

[16 of 46 positions shown; findings below may reference images not displayed]

COMMENT:  Unless the patient's specific circumstances suggest otherwise, any liver lesion 0.5 cm or less, any cystic kidney lesion less than 1.0 cm, and/or any adrenal lesion 1.0 cm or less not otherwise characterized in this report as possessing suspicious or indeterminate imaging features is/are most likely to be benign and do not require follow-up imaging or biopsy.
FINDINGS: Lower Thorax: Unremarkable.
Liver: Unremarkable.
Gallbladder/Biliary Tree: Unremarkable.
Spleen: Unremarkable.
Pancreas: Unremarkable.
Adrenal Glands: Unremarkable.
Kidneys/Ureters: Unremarkable.
Gastrointestinal: No bowel obstruction. Normal appendix.
Bladder: Partially decompressed. Mild perivesicular fat stranding.
Pelvic Organs:  No acute abnormality.
Lymph Nodes: No pathologically enlarged lymph nodes. Prominent bilateral inguinal lymph nodes, likely reactive in etiology.
Vessels: Normal.
Peritoneum/Retroperitoneum: Trace pelvic free fluid, likely physiologic.
Bones: No acute osseous abnormality.
Soft tissues: Air and fluid collection within the right rectus abdominis muscle, measuring 3.8 x 2.1 x 6.9 cm (series 2, image 140 and series 602, image 90). Associated overlying skin thickening of the anterior pelvis with underlying lobulated edema, measuring 8.9 x 3.1 cm.
IMPRESSION: Limited evaluation of the abdomen and pelvis with lack of IV contrast.
1.  Air and fluid collection within the right rectus abdominis muscle, measuring up to 6.9 cm. Overlying soft tissue edema and skin thickening. Findings are concerning for an infectious process.
2.  Partially decompressed bladder with mild perivesicular fat stranding. Correlate with urinalysis for acute cystitis.
Is the patient pregnant?
No

## 2023-03-19 IMAGING — CT CT NECK ANGIO WITH AND WITHOUT IV CONTRAST
2 of 13 series · 5 of 35 positions shown · non-contrast
Comparison: none

Dizziness. Wide-based gait, left sided numbness.
FINAL REPORT:
HISTORY: Dizziness. Gait abnormality. Left-sided numbness.
CTA head and neck with and without contrast.
TECHNIQUE: Informed consent obtained prior to administration of 1 non-ionic intravenous contrast administered without adverse response. Axial CTA imaging obtained from the aortic arch through the vertex. Sagittal and coronal maximum intensity 3-D projections obtained as well. Stenoses measured according to the NASCET criteria.

[Series 6: head and neck thins · axial · 0.49mm/px · z∈[-103,+78]mm · 3 of 579 slices shown]
[im 145/579  soft-tissue]
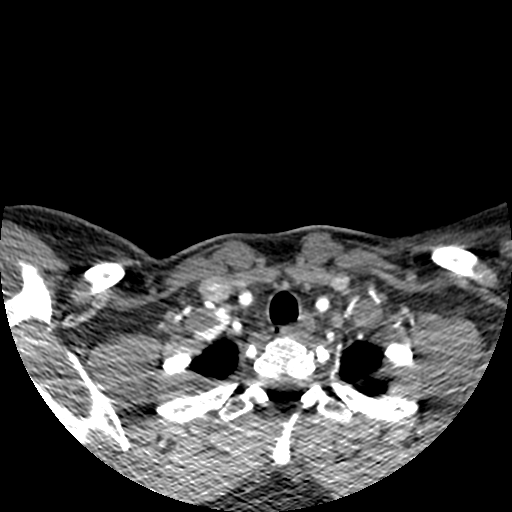
[im 290/579  bone]
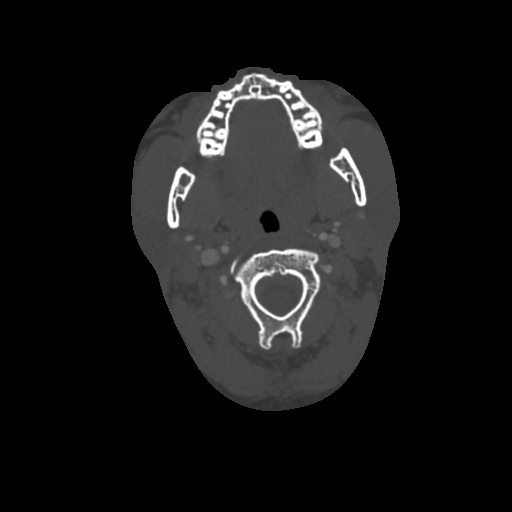
[im 434/579  soft-tissue]
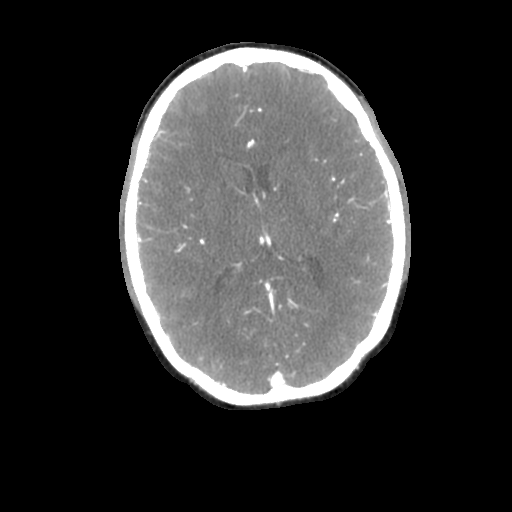

[Series 606: sag 1mm · sagittal · 0.70mm/px · 2 of 251 slices shown]
[im 96/251  soft-tissue]
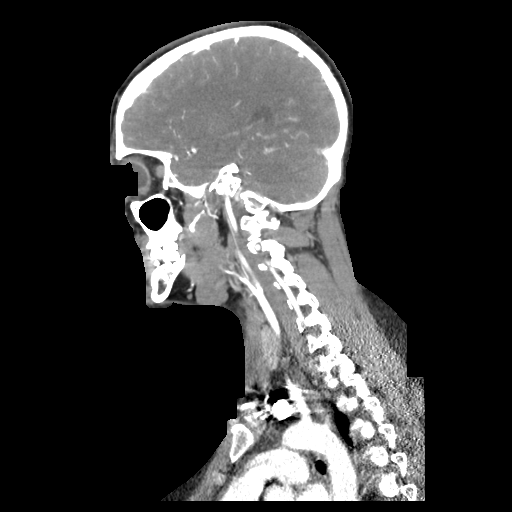
[im 250/251  soft-tissue]
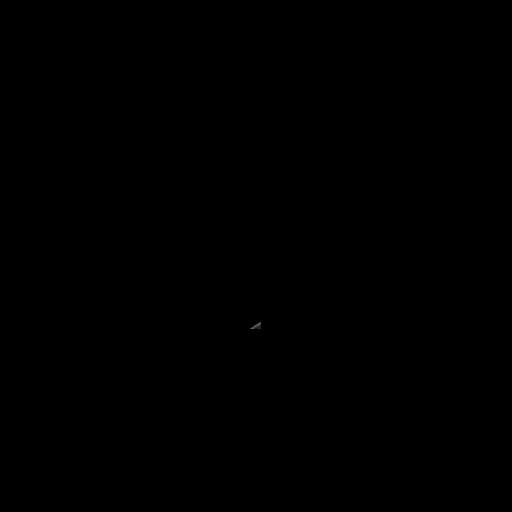

[5 of 35 positions shown; findings below may reference images not displayed]

FINDINGS: Initial noncontrast CT head demonstrates no acute intracranial hemorrhage, mass or mass effect, midline shift, or extra-axial fluid collection. No hydrocephalus. The basilar cisterns and aqueducts are maintained. The included portions of the orbits, paranasal sinuses, and mastoids are clear.
CTA neck with contrast:
The lung apices are clear. The brachiocephalic, left common carotid, and left subclavian arteries are patent off the aortic arch. The bilateral vertebral artery origins are patent. The extracranial vertebral arteries are patent and codominant. The bilateral common carotid arteries are patent. The carotid bifurcations are patent. The extracranial ICA and ECA vessels are patent.
CTA head with contrast:
The intracranial vertebral and PICA vessels are patent. The basilar, superior cerebellar, and posterior cerebral arteries are patent. The right posterior communicating artery is patent. A left posterior communicating artery is not identified for certain. The bilateral intracranial ICA vessels are patent. The anterior communicating artery is patent. The bilateral ACA and MCA vessels are patent to second and third order branches. No significant stenosis, large vessel occlusion, dissection, or intracranial aneurysm.
The dural venous sinuses are patent.
Bone windows demonstrate no aggressive osseous lesions.
IMPRESSION: No significant stenosis, large vessel occlusion, or acute abnormality on the CTA head and neck with and without contrast.
All CT scans at this facility use dose modulation and/or weight based dosing when appropriate to reduce radiation dose to as low as reasonably achievable. (#[id number])
Is the patient pregnant?
Unknown

## 2023-03-19 IMAGING — DX XR CHEST 1 VIEW
1 series · 1 of 1 positions shown · non-contrast
Comparison: none

DIZZINESS
PREGNANCY TEST ORDERED AFTER X-RAY
FINAL REPORT:
HISTORY: Dizziness. Chest pain.
Portable chest, single view:

[AP]
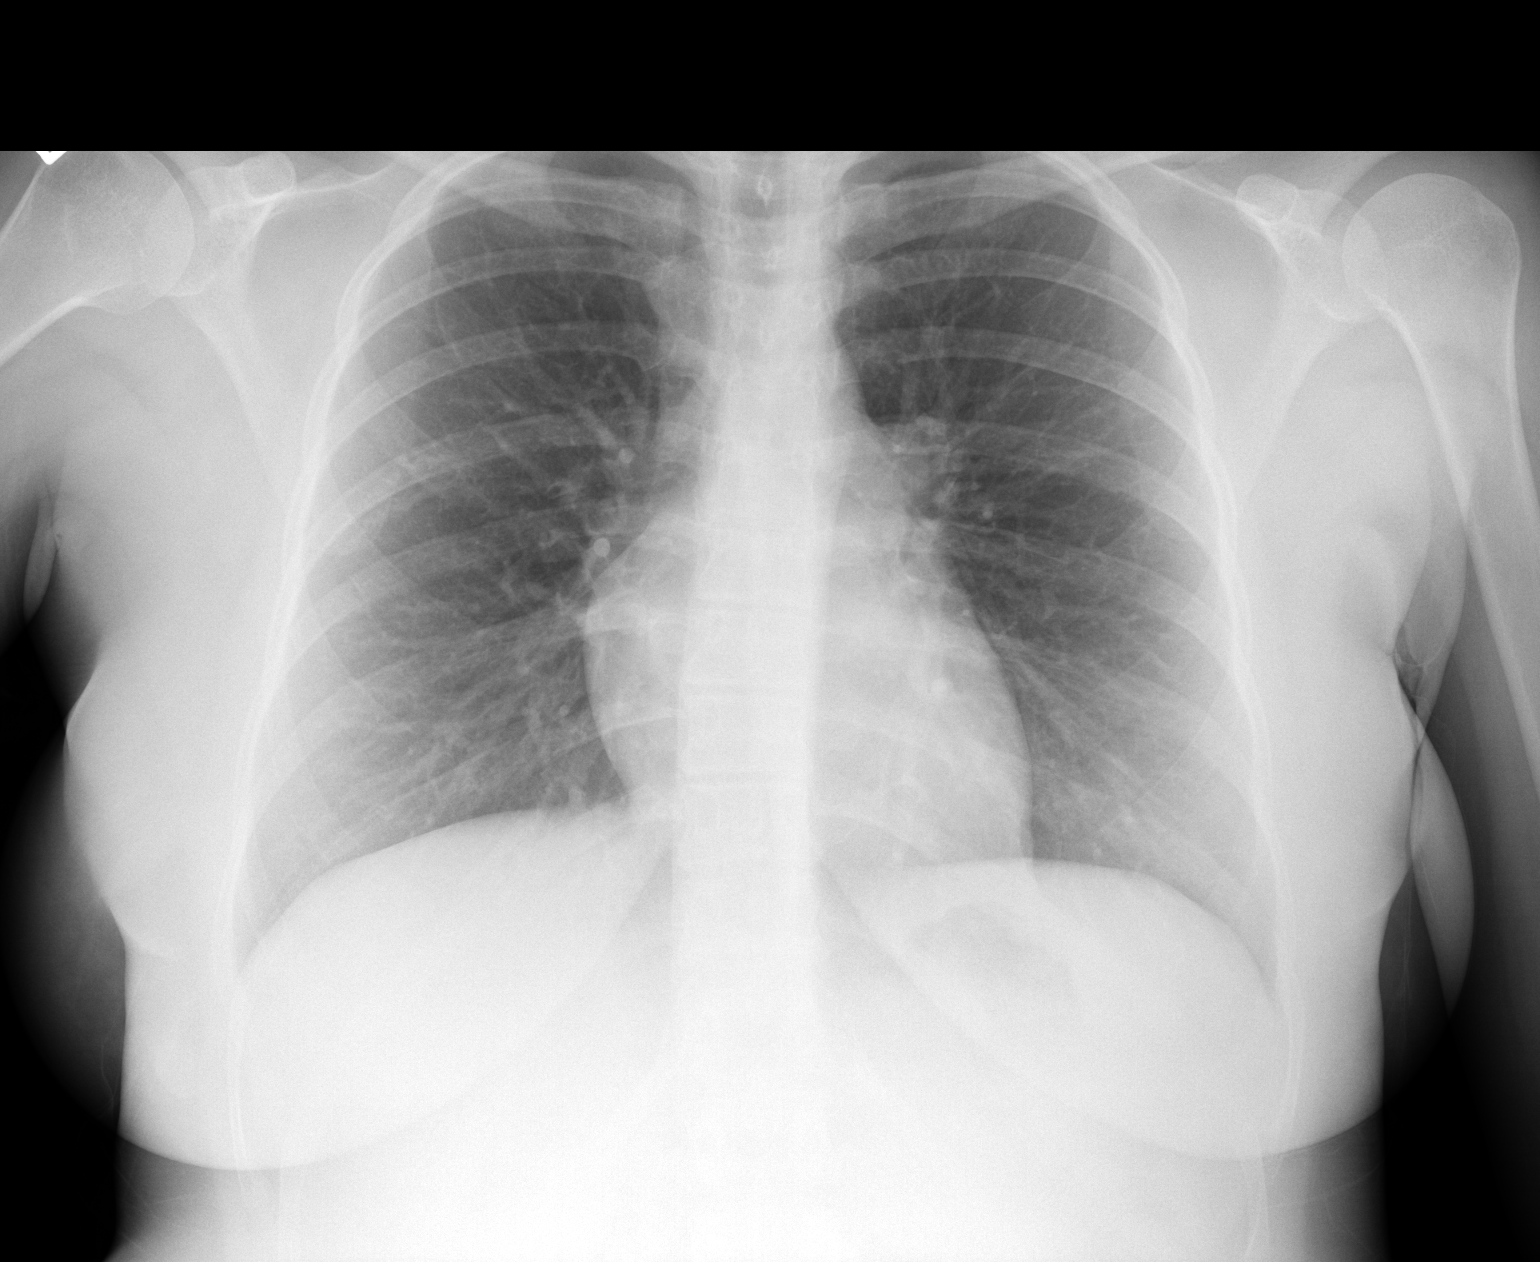

[1 of 1 positions shown; findings below may reference images not displayed]

FINDINGS: Prior: 02/21/2022.
The lungs are clear without focal interstitial process, consolidation, or effusion. Cardiomediastinal contours are within normal limits without pneumothorax.
IMPRESSION: No gross abnormality on this portable exam.
Is the patient pregnant?
No

## 2023-03-20 IMAGING — MR MRI BRAIN WITH AND WITHOUT CONTRAST
16 of 23 series · 41 of 48 positions shown · IV contrast (VUEWAY)
Comparison: none

FINAL REPORT:
HISTORY: Dizziness and giddiness. Anesthesia of skin. Unsteadiness on feet.
MRI brain with and without contrast.
TECHNIQUE: Axial T1-weighted precontrast and axial, sagittal, and coronal T1-weighted postcontrast imaging obtained of the brain.

[Series 2001: survey br_mpr_sag · sagittal · 1.6mm · 1.60mm/px · 1 of 5 slices shown (1 of 2)]
[im 1/5]
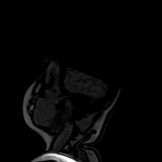

[Series 3001: survey br_mpr_cor · coronal · 1.6mm · 1.60mm/px · 1 of 3 slices shown (1 of 2)]
[im 1/3]
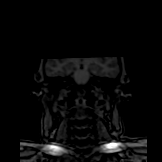

[Series 4001: survey br_mpr_tra · axial · 1.6mm · 1.60mm/px · 1 of 3 slices shown (1 of 2)]
[im 1/3]
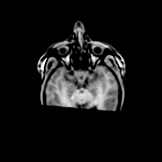

[Series 5001: T1 · sagittal · 5.0mm · 0.78mm/px · 2 of 25 slices shown (1 of 2)]
[im 1/25]
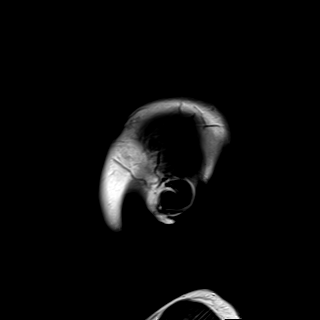
[im 25/25]
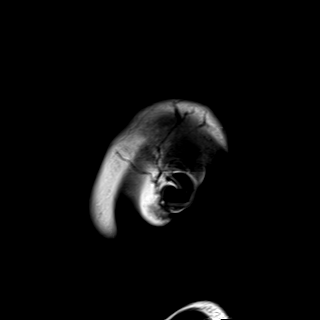

[Series 7001: survey br_mpr_sag · sagittal · 1.6mm · 1.60mm/px · 1 of 5 slices shown (2 of 2)]
[im 1/5]
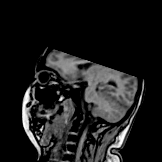

[Series 8001: survey br_mpr_cor · coronal · 1.6mm · 1.60mm/px · 1 of 3 slices shown (2 of 2)]
[im 1/3]
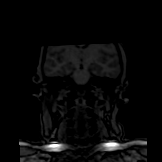

[Series 9001: survey br_mpr_tra · axial · 1.6mm · 1.60mm/px · 1 of 3 slices shown (2 of 2)]
[im 1/3]
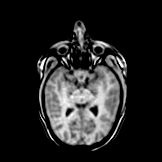

[FLAIR · sagittal · 5.0mm · 0.90mm/px · 2 of 24 slices shown]
[im 1/24]
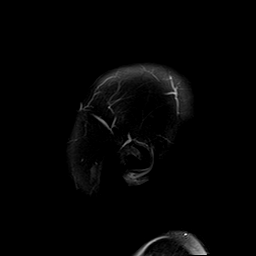
[im 24/24]
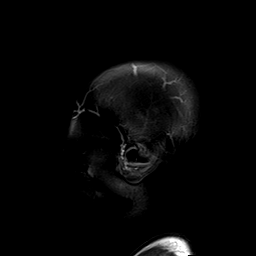

[T1 · axial · 5.0mm · 0.72mm/px · z∈[+39,+188]mm · 3 of 25 slices shown (2 of 2)]
[im 1/25]
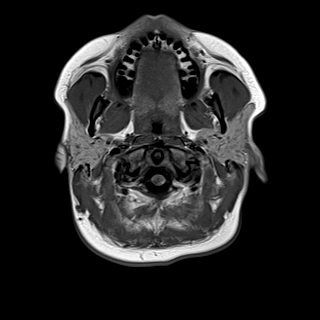
[im 13/25]
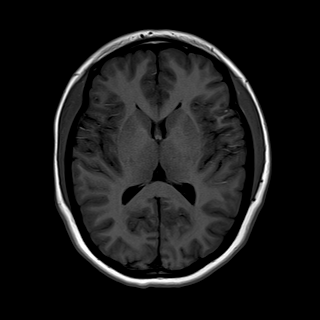
[im 25/25]
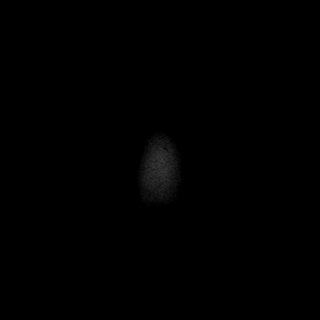

[survey · sagittal · 1.6mm · 1.62mm/px · 16 of 128 slices shown]
[im 1/128]
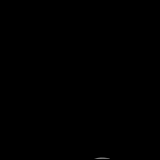
[im 9/128]
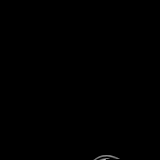
[im 17/128]
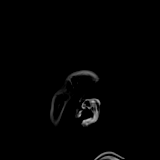
[im 26/128]
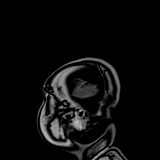
[im 34/128]
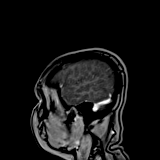
[im 43/128]
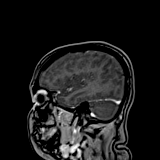
[im 51/128]
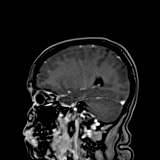
[im 60/128]
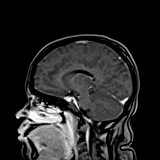
[im 68/128]
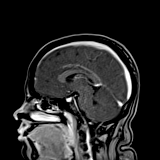
[im 77/128]
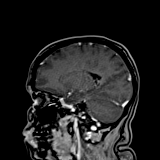
[im 85/128]
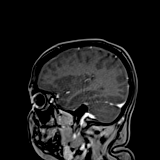
[im 94/128]
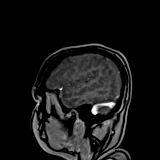
[im 102/128]
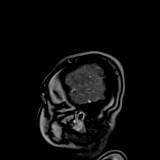
[im 111/128]
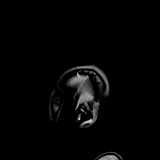
[im 119/128]
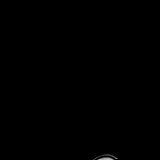
[im 128/128]
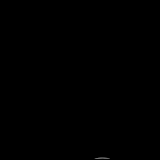

[survey_mpr_sag · sagittal · 1.6mm · 1.60mm/px · 1 of 5 slices shown]
[im 1/5]
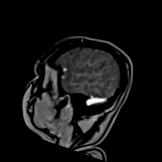

[survey_mpr_cor · coronal · 1.6mm · 1.60mm/px · 1 of 3 slices shown]
[im 1/3]
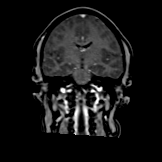

[survey_mpr_tra · axial · 1.6mm · 1.60mm/px · 1 of 3 slices shown]
[im 1/3]
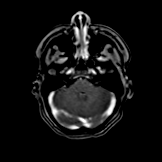

[T1 post-contrast · axial · 5.0mm · 0.72mm/px · z∈[+30,+180]mm · 3 of 25 slices shown (1 of 3)]
[im 1/25]
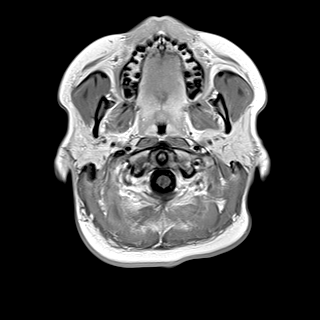
[im 13/25]
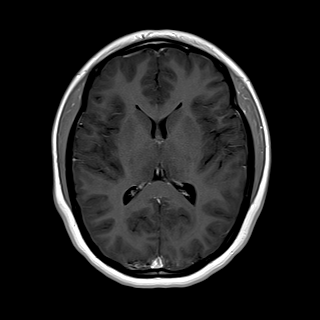
[im 25/25]
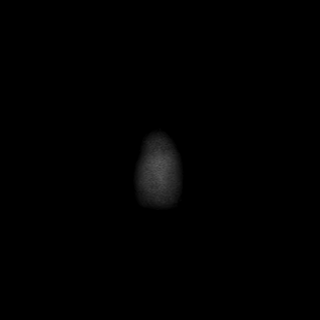

[T1 post-contrast · coronal · 5.0mm · 0.72mm/px · 3 of 25 slices shown (2 of 3)]
[im 1/25]
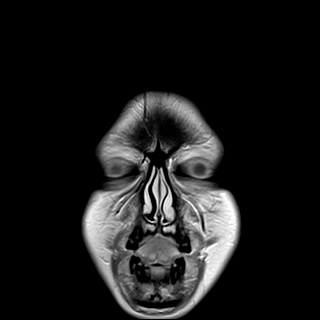
[im 13/25]
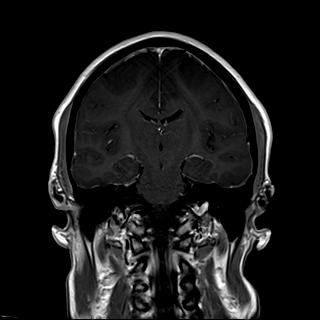
[im 25/25]
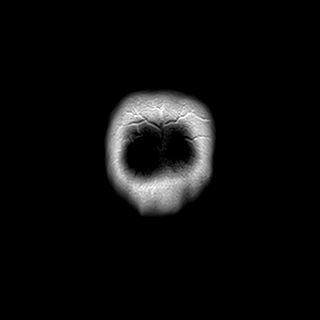

[T1 post-contrast · sagittal · 5.0mm · 0.72mm/px · 3 of 24 slices shown (3 of 3)]
[im 1/24]
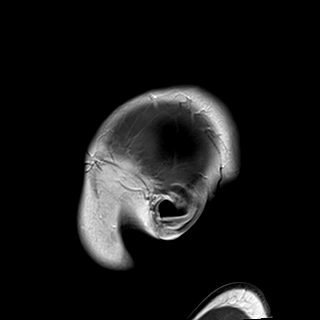
[im 12/24]
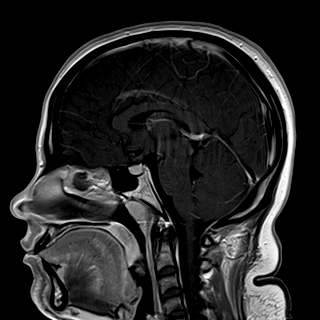
[im 24/24]
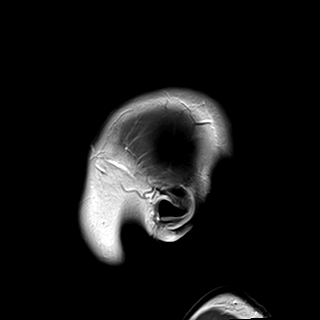

[41 of 48 positions shown; findings below may reference images not displayed]

FINDINGS: Comparison is with the MRI of the brain without contrast also 03/20/2023.
Postcontrast imaging confirms no abnormal enhancing parenchymal, leptomeningeal, or dural lesions.
No cerebral or cerebellar mass or mass effect, midline shift, or extra-axial fluid collection. No hydrocephalus. Bone marrow signal intensity is maintained in the skull base and calvarium.
The orbits, paranasal sinuses, and mastoids are clear.
IMPRESSION: No abnormality demonstrated. No abnormal enhancing lesions to suggest active demyelinating plaques.
Is the patient pregnant?
No

## 2023-03-20 IMAGING — MR MRI THORACIC SPINE WITH AND  WITHOUT CONTRAST
12 of 20 series · 31 of 48 positions shown · IV contrast (gadolinium)
Comparison: none

FINAL REPORT:
HISTORY: MS evaluation; dizziness and giddiness; unsteadiness on feet; anesthesia of skin.
MRI thoracic spine with and without contrast.
TECHNIQUE: Multiplanar multisequence MR imaging obtained of the thoracic spine pre and post contrast after the uneventful administration of IV gadolinium.

[T2 · sagittal · 4.0mm · 0.76mm/px · 2 of 15 slices shown (1 of 4)]
[im 1/15]
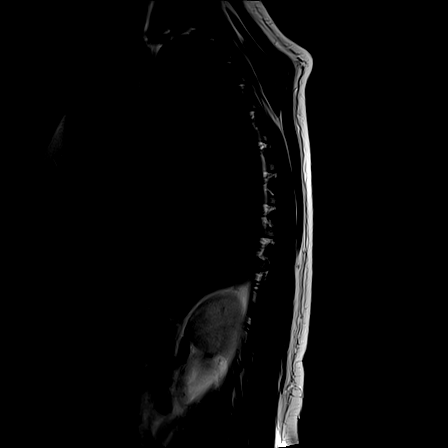
[im 15/15]
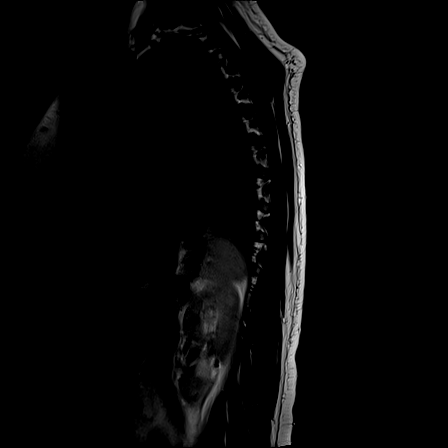

[T1 · sagittal · 4.0mm · 1.06mm/px · 2 of 15 slices shown (1 of 4)]
[im 1/15]
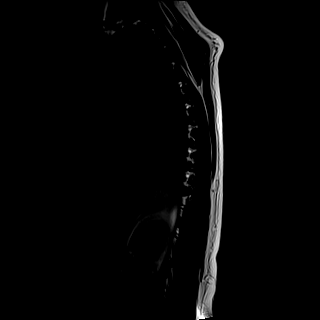
[im 15/15]
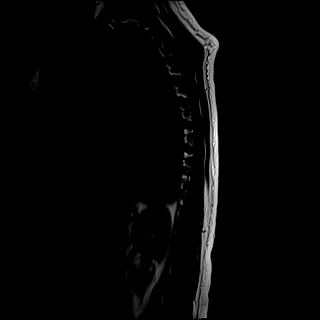

[STIR · sagittal · 4.0mm · 0.89mm/px · 2 of 15 slices shown]
[im 1/15]
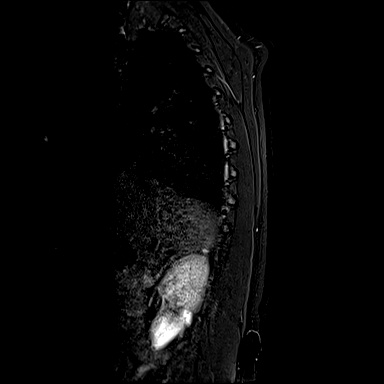
[im 15/15]
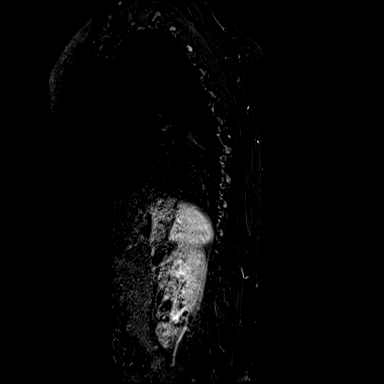

[T2 · axial · 4.0mm · 0.78mm/px · z∈[-221,-52]mm · 5 of 40 slices shown (2 of 4)]
[im 1/40]
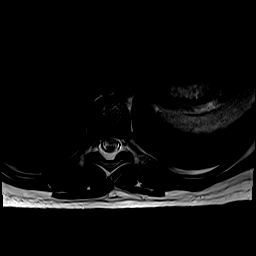
[im 10/40]
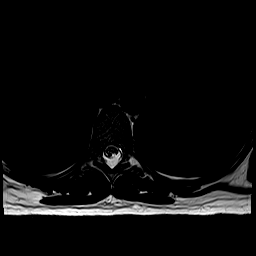
[im 20/40]
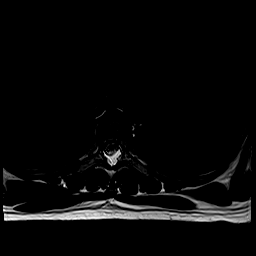
[im 30/40]
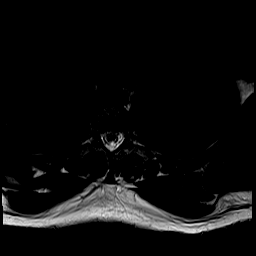
[im 40/40]
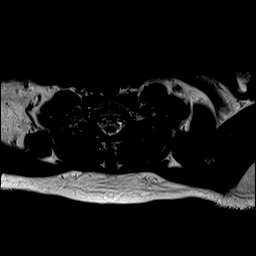

[T2 · axial · 4.0mm · 0.78mm/px · z∈[-343,-172]mm · 5 of 40 slices shown (3 of 4)]
[im 1/40]
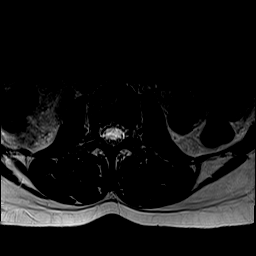
[im 10/40]
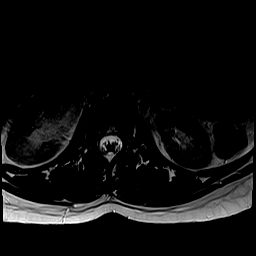
[im 20/40]
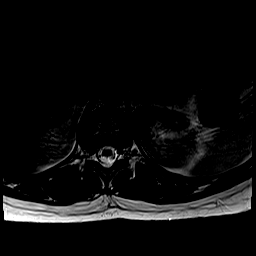
[im 30/40]
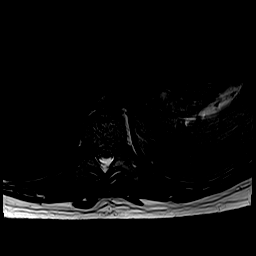
[im 40/40]
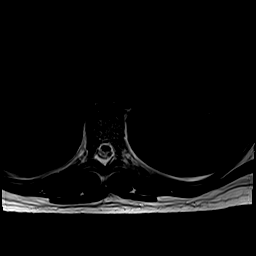

[T1 · axial · non-contrast · 7.0mm · 0.39mm/px · z∈[-213,-52]mm · 2 of 12 slices shown (2 of 4)]
[im 1/12]
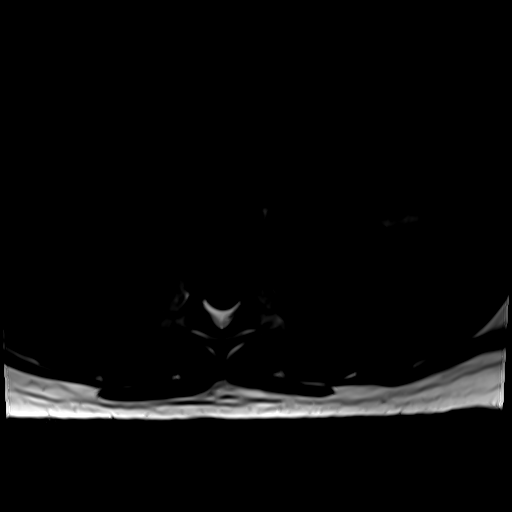
[im 12/12]
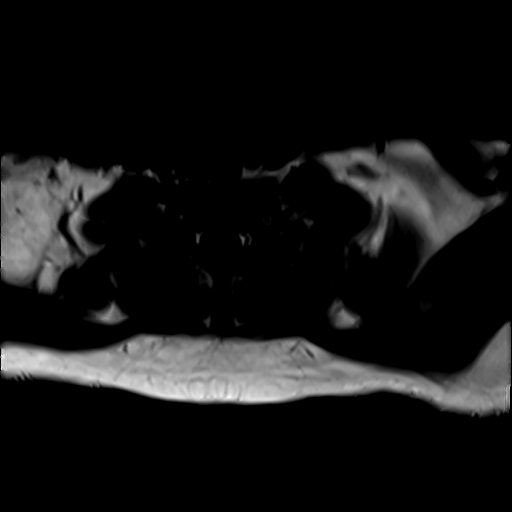

[T2 · sagittal · 4.0mm · 0.76mm/px · 2 of 15 slices shown (4 of 4)]
[im 1/15]
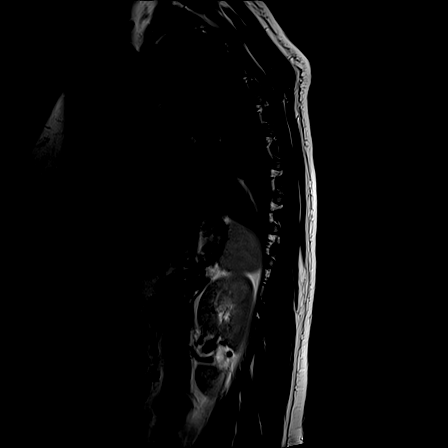
[im 15/15]
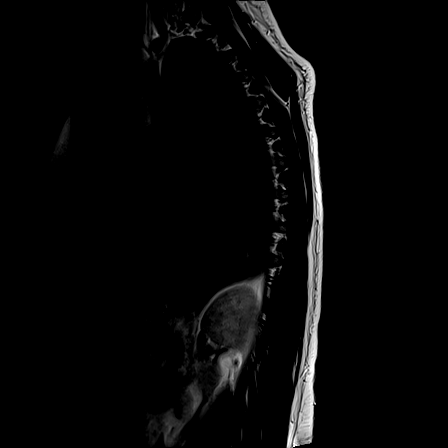

[T1 · axial · non-contrast · 7.0mm · 0.39mm/px · z∈[-247,-87]mm · 3 of 24 slices shown (3 of 4)]
[im 1/24]
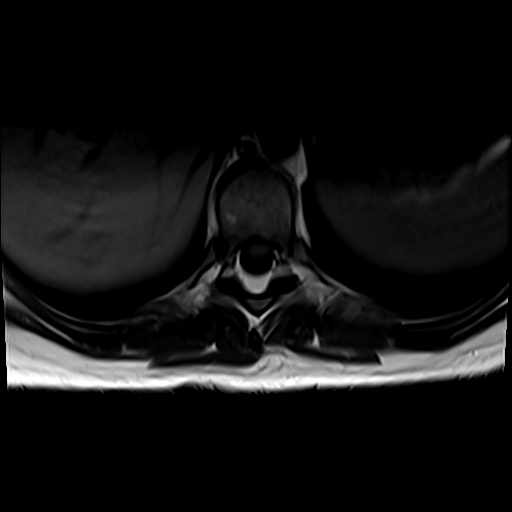
[im 12/24]
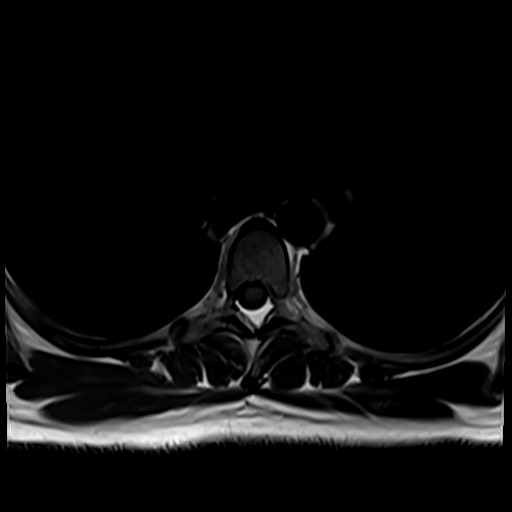
[im 24/24]
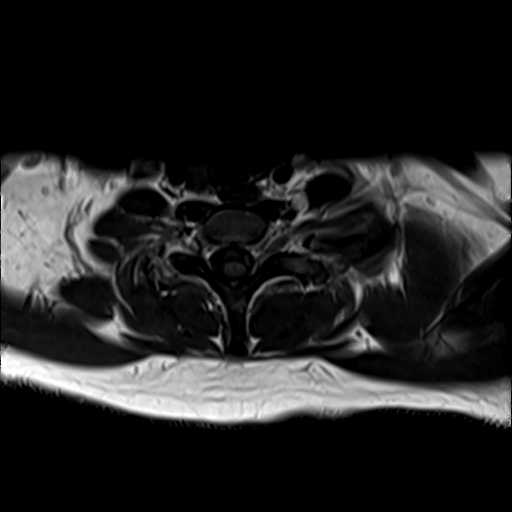

[T1 · axial · non-contrast · 7.0mm · 0.39mm/px · z∈[-381,-212]mm · 3 of 24 slices shown (4 of 4)]
[im 1/24]
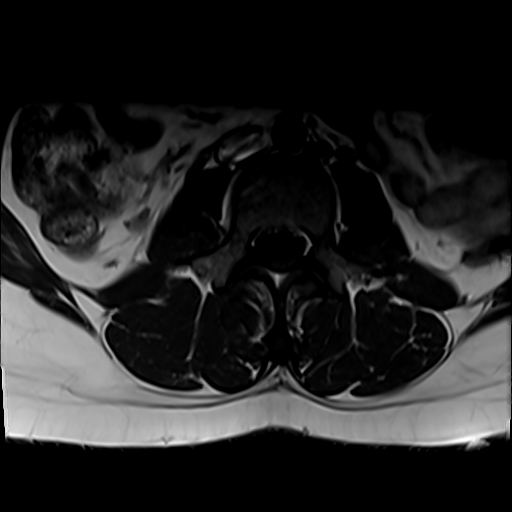
[im 12/24]
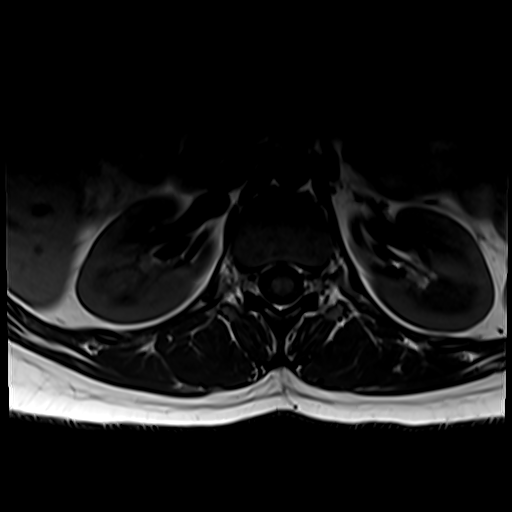
[im 24/24]
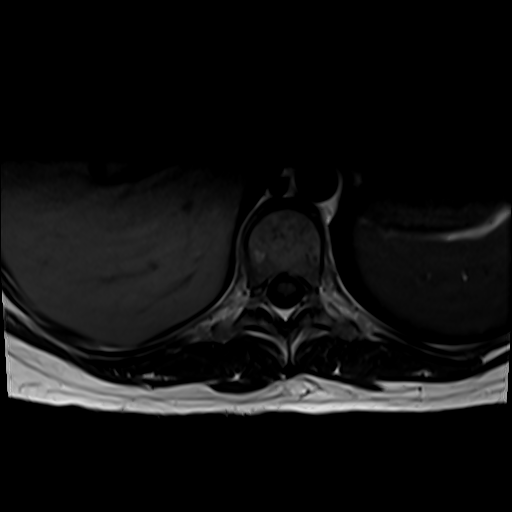

[T1 post-contrast · sagittal · 4.0mm · 0.89mm/px · 2 of 15 slices shown (1 of 2)]
[im 1/15]
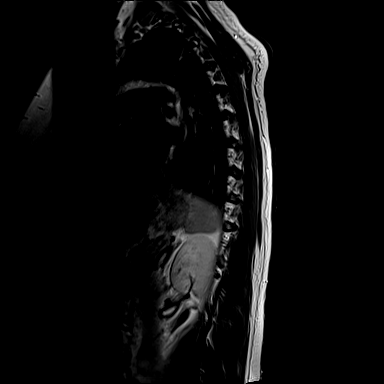
[im 15/15]
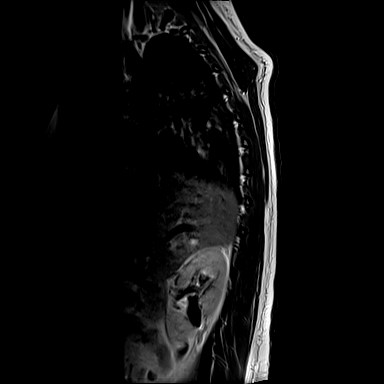

[T1 post-contrast · axial · 7.0mm · 0.39mm/px · 1 of 24 slices shown (2 of 2)]
[im 1/24]
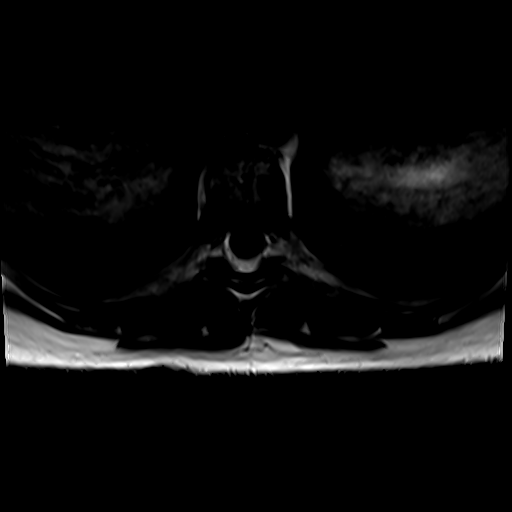

[T1 fat-sat post-contrast · sagittal · 4.0mm · 1.06mm/px · 2 of 15 slices shown]
[im 1/15]
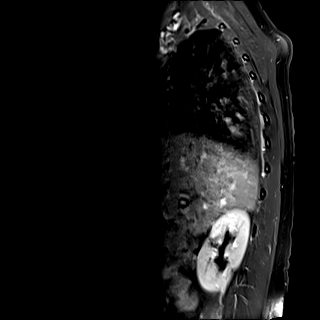
[im 15/15]
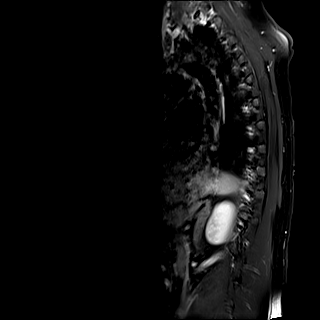

[31 of 48 positions shown; findings below may reference images not displayed]

FINDINGS: Sagittal imaging demonstrates mild degenerative endplate changes and Schmorl's nodes at several contiguous levels in the mid to lower thoracic spine. Some minimal to mild disc height loss is noted at these levels with shallow anterior effacement of thecal sac from earliest disease at these levels. Vertebral body height, shape, signal, and alignment are otherwise maintained. The thoracic cord is satisfactory in signal intensity and contour.
Axial MR imaging demonstrates some mild anterior effacement of thecal sac from early degenerative disc disease at contiguous levels in the mid to lower thoracic spine approximately T7-T12. Central spinal stenosis does not exceed a mild degree.
Postcontrast imaging demonstrates no abnormal enhancing intramedullary, extra medullary intradural, or extradural lesions.
IMPRESSION: 1. Early degenerative endplate and disc disease approximately T7-T12 with central spinal stenosis not exceeding a mild degree.
2. No evidence for cord signal abnormality or enhancement in the cord to suggest active demyelinating plaque.
Is the patient pregnant?
No

## 2023-03-20 IMAGING — MR MRI CERVICAL SPINE WITH AND WITHOUT CONTRAST
11 of 18 series · 33 of 48 positions shown · IV contrast (gadolinium)
Comparison: none

FINAL REPORT:
HISTORY: MS evaluation; dizziness and giddiness; unsteadiness on feet; anesthesia of skin.
MRI cervical spine with and without contrast.
TECHNIQUE: Multiplanar multisequence MR imaging obtained of the cervical spine pre and postcontrast after the uneventful administration of IV gadolinium.

[T2 · sagittal · 3.0mm · 0.69mm/px · 2 of 15 slices shown (1 of 2)]
[im 1/15]
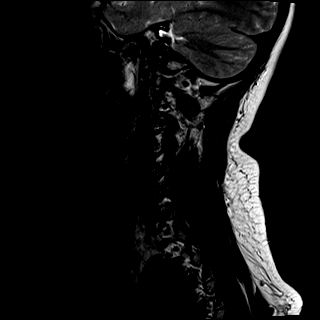
[im 15/15]
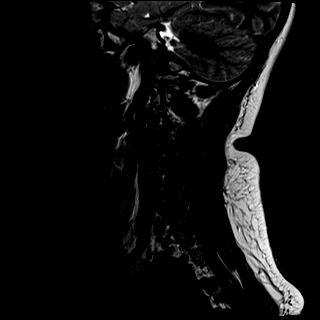

[STIR · sagittal · 3.0mm · 0.43mm/px · 2 of 15 slices shown]
[im 1/15]
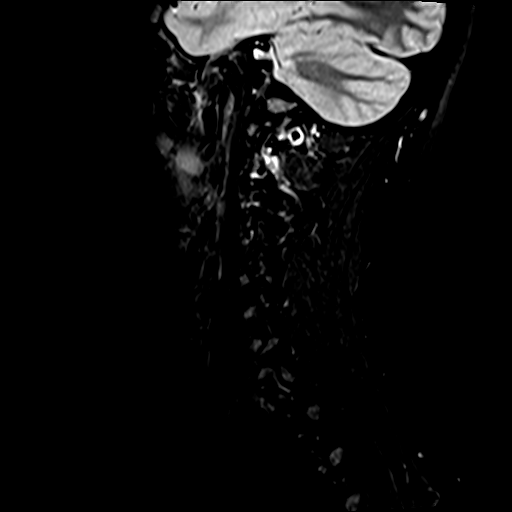
[im 15/15]
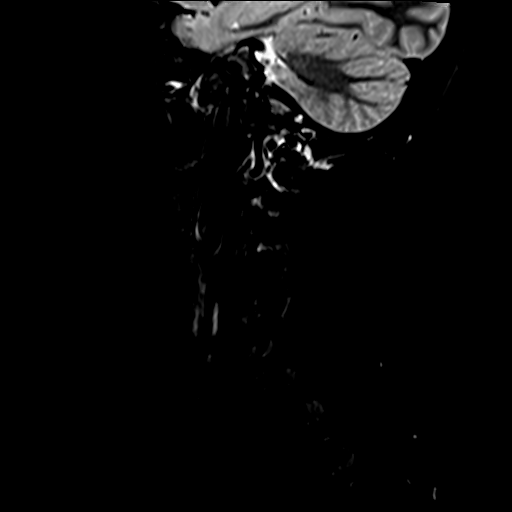

[T1 · sagittal · 3.0mm · 0.62mm/px · 2 of 15 slices shown (1 of 2)]
[im 1/15]
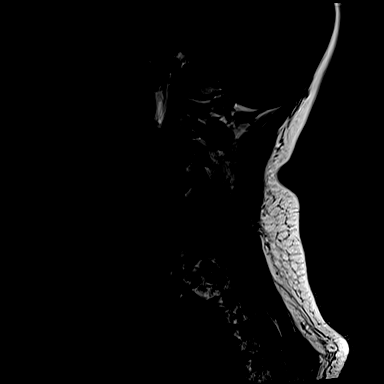
[im 15/15]
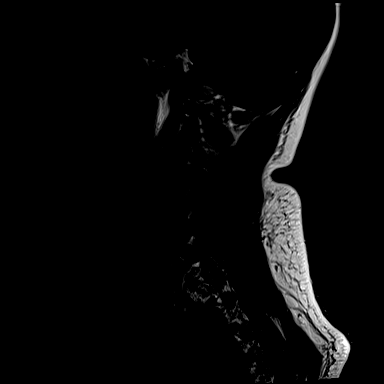

[GRE · axial · 3.0mm · 0.94mm/px · z∈[-70,+16]mm · 4 of 28 slices shown]
[im 1/28]
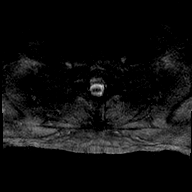
[im 10/28]
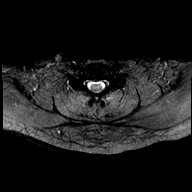
[im 19/28]
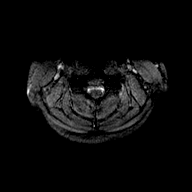
[im 28/28]
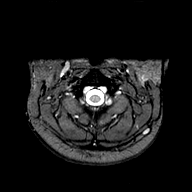

[T2 · axial · 3.0mm · 0.70mm/px · z∈[-78,+35]mm · 6 of 40 slices shown (2 of 2)]
[im 1/40]
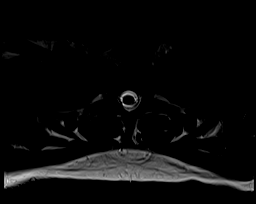
[im 8/40]
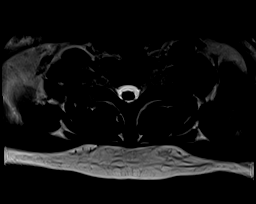
[im 16/40]
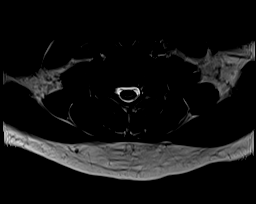
[im 24/40]
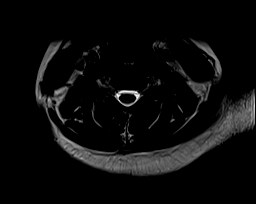
[im 32/40]
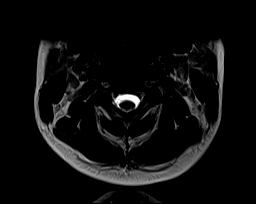
[im 40/40]
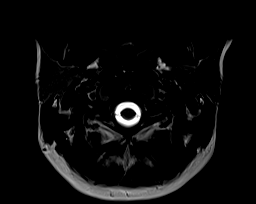

[T1 · axial · non-contrast · 3.0mm · 0.47mm/px · z∈[-84,+31]mm · 6 of 37 slices shown (2 of 2)]
[im 1/37]
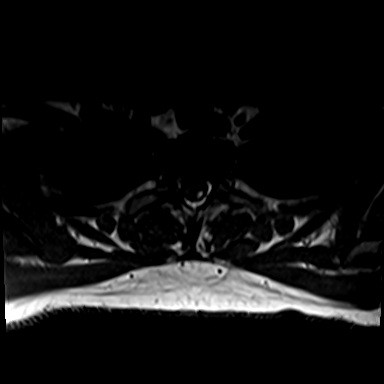
[im 8/37]
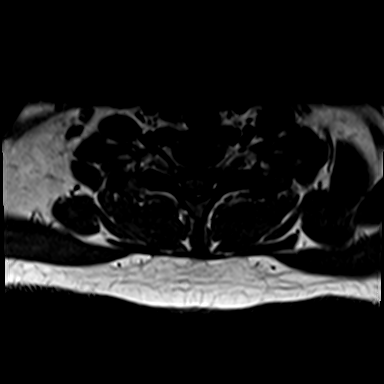
[im 15/37]
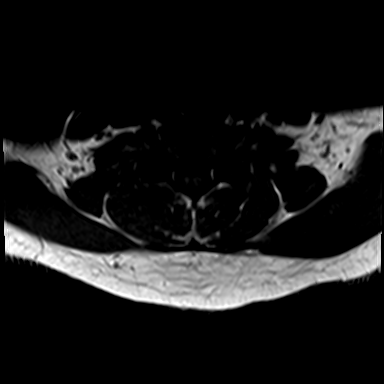
[im 22/37]
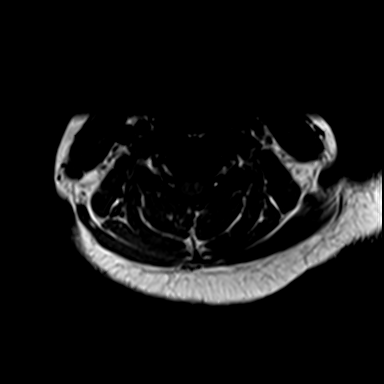
[im 29/37]
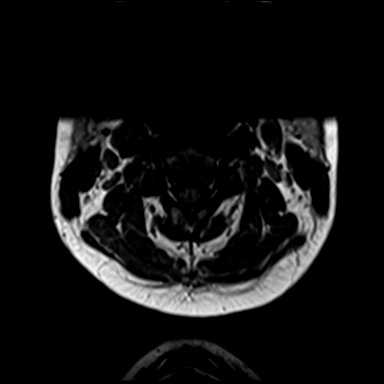
[im 37/37]
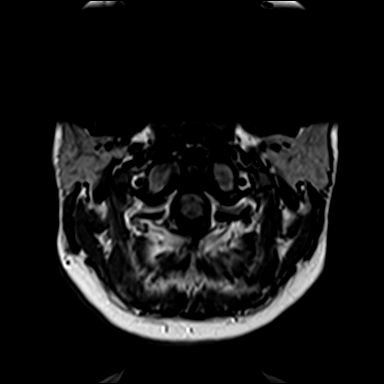

[survey_mpr_sag · sagittal · 1.7mm · 1.67mm/px · 2 of 15 slices shown]
[im 1/15]
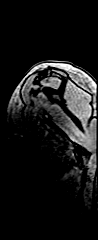
[im 15/15]
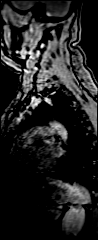

[survey_mpr_(person_name) · axial · 1.7mm · 1.67mm/px · 1 of 7 slices shown]
[im 1/7]
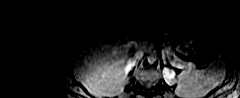

[T1 post-contrast · sagittal · 3.0mm · 0.43mm/px · 2 of 15 slices shown (1 of 2)]
[im 1/15]
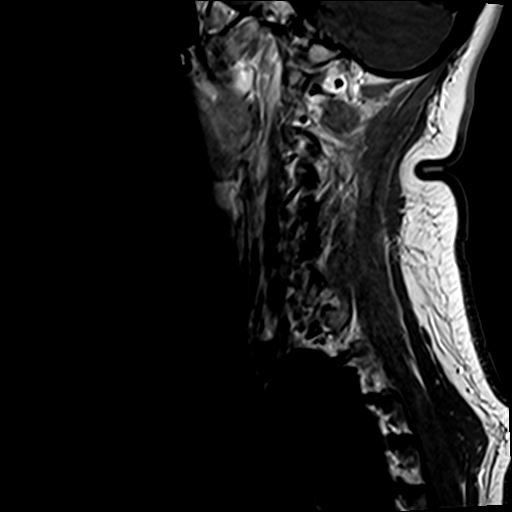
[im 15/15]
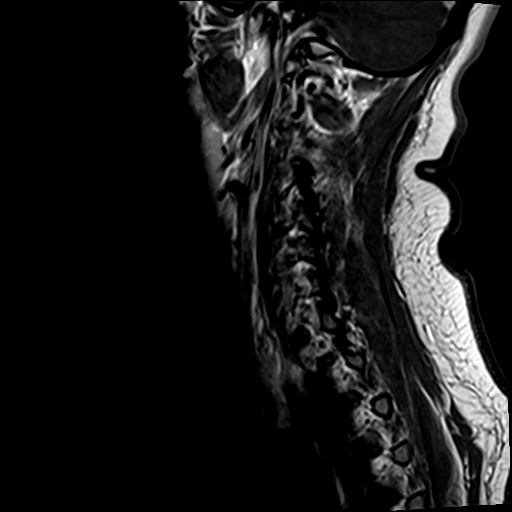

[T1 post-contrast · axial · 3.0mm · 0.35mm/px · z∈[-104,-19]mm · 4 of 28 slices shown (2 of 2)]
[im 1/28]
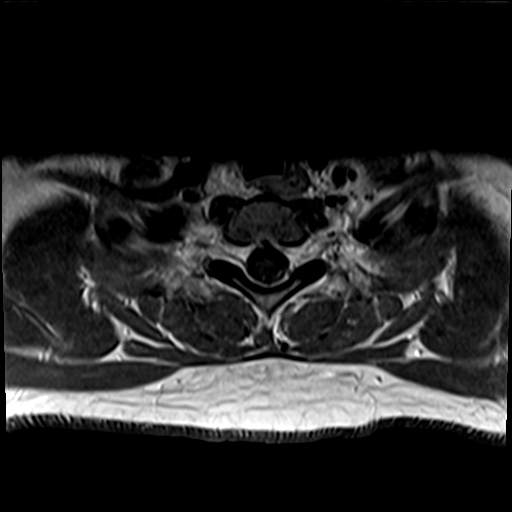
[im 10/28]
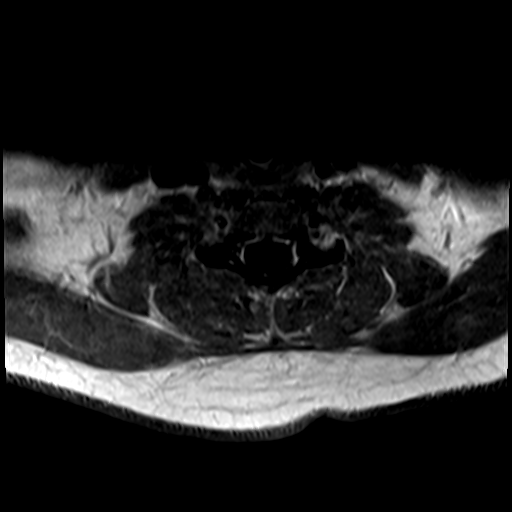
[im 19/28]
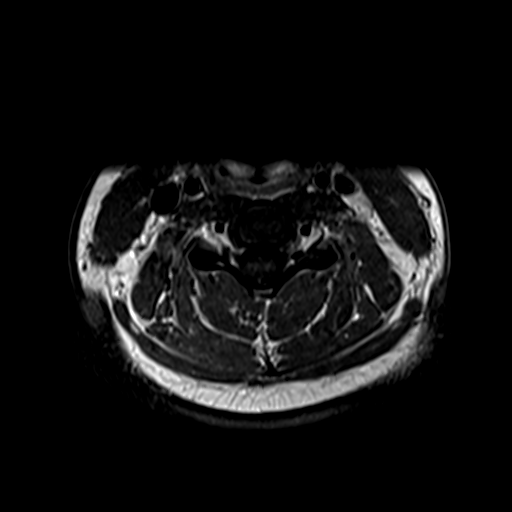
[im 28/28]
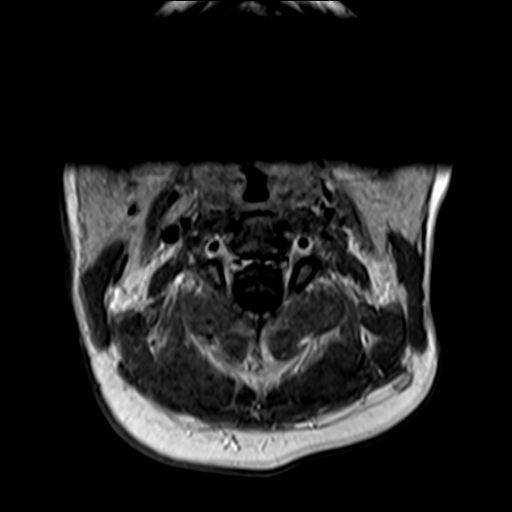

[T1 fat-sat post-contrast · sagittal · 3.0mm · 0.43mm/px · 2 of 15 slices shown]
[im 1/15]
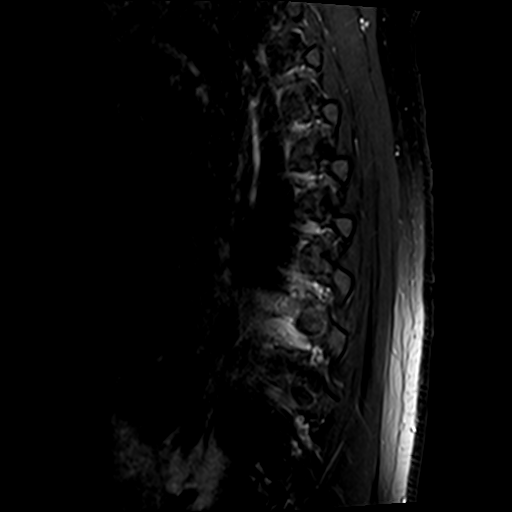
[im 15/15]
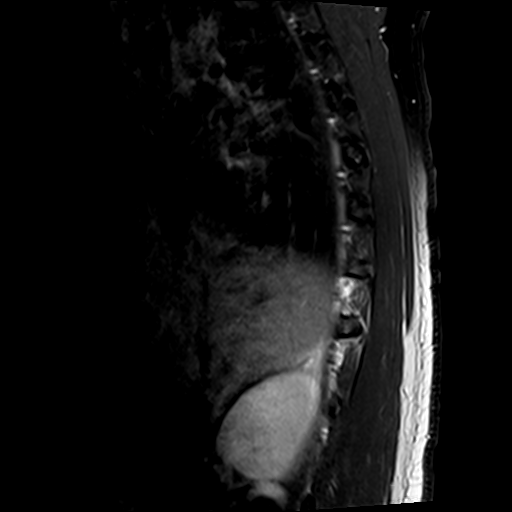

[33 of 48 positions shown; findings below may reference images not displayed]

FINDINGS: Sagittal imaging performed from occiput through inferior T3. Vertebral body height, shape, signal, and alignment are maintained in the cervical spine. Disc spaces and signal are preserved. The facet joints are aligned. The craniovertebral junction is satisfactory in appearance. The cervical cord is normal in contour and signal intensity.
Axial MR imaging demonstrates the following:
C2-3: No disc herniation. No central spinal or neuroforaminal stenosis.
C3-4: No disc herniation. No central spinal or neuroforaminal stenosis.
C4-5: No disc herniation. No central spinal or neuroforaminal stenosis.
C5-6: No disc herniation. No central spinal or neuroforaminal stenosis.
C6-7: No disc herniation. No central spinal or neuroforaminal stenosis.
C7-T1: No disc herniation. No central spinal or neuroforaminal stenosis.
Postcontrast imaging demonstrates no abnormal enhancing intramedullary, extra medullary intradural, or extradural lesions.
IMPRESSION: Unremarkable MRI of the cervical spine with without contrast. No cervical cord signal abnormality or evidence for demyelinating plaques.
Is the patient pregnant?
No

## 2023-03-20 IMAGING — MR MRI BRAIN WITHOUT CONTRAST
8 of 11 series · 32 of 48 positions shown · non-contrast
Comparison: CTA head and neck March 19, 2023

FINAL REPORT:
EXAM: MRI of the brain without contrast.
HISTORY: Stroke-like symptoms
TECHNIQUE: Multisequence multiplanar images of the brain were obtained without contrast.

[Series 2001: survey_mpr_sag · sagittal · 1.6mm · 1.60mm/px · 1 of 5 slices shown]
[im 1/5]
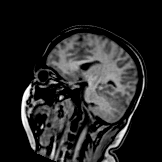

[Series 3001: survey_mpr_cor · coronal · 1.6mm · 1.60mm/px · 1 of 3 slices shown]
[im 1/3]
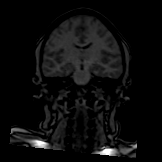

[Series 4001: survey_mpr_(person_name) · axial · 1.6mm · 1.60mm/px · 1 of 3 slices shown]
[im 1/3]
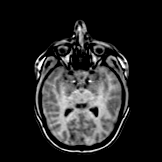

[Series 5001: T1 · sagittal · 5.0mm · 0.40mm/px · 5 of 27 slices shown (1 of 2)]
[im 1/27]
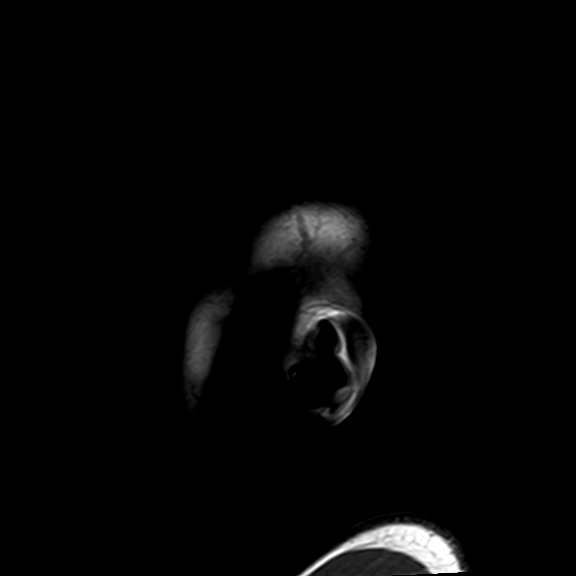
[im 7/27]
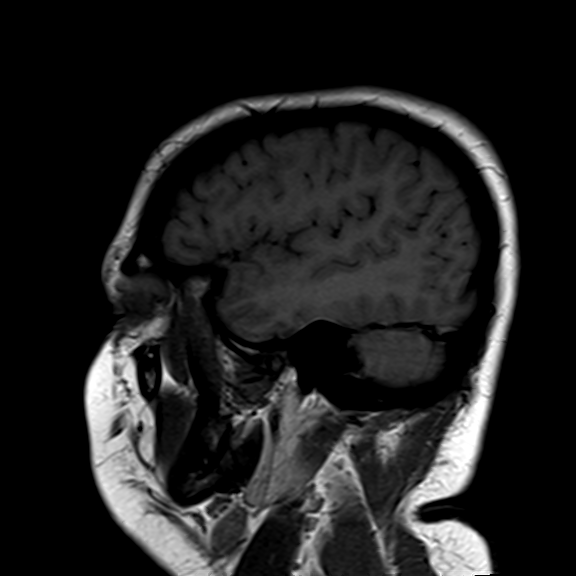
[im 14/27]
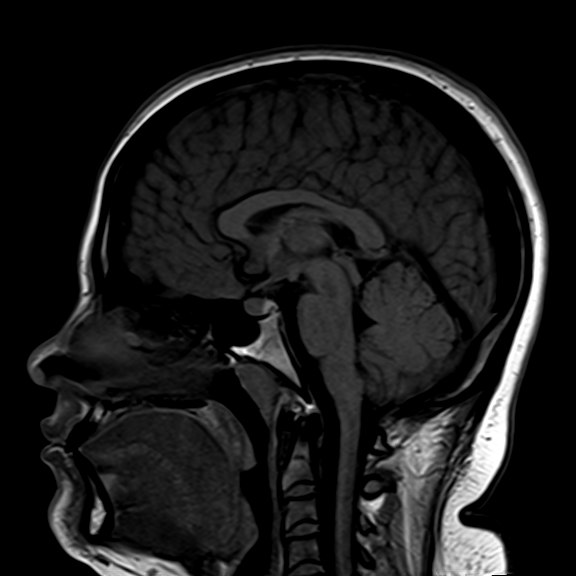
[im 20/27]
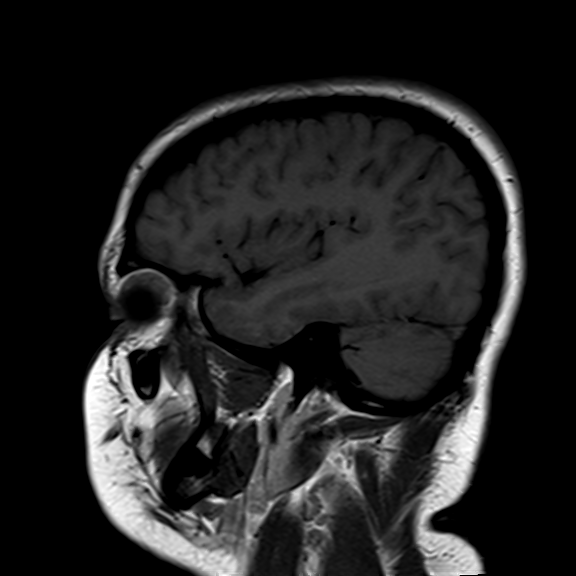
[im 27/27]
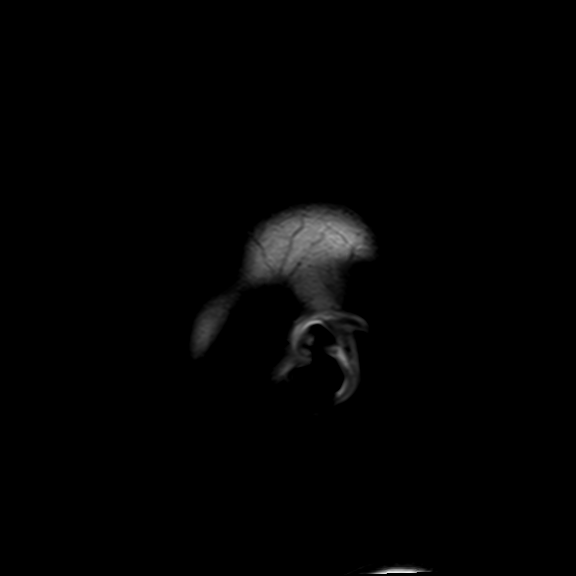

[Series 9001: T2 · axial · 5.0mm · 0.51mm/px · z∈[-65,+78]mm · 6 of 25 slices shown]
[im 1/25]
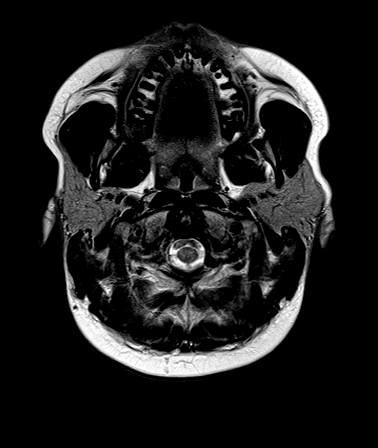
[im 5/25]
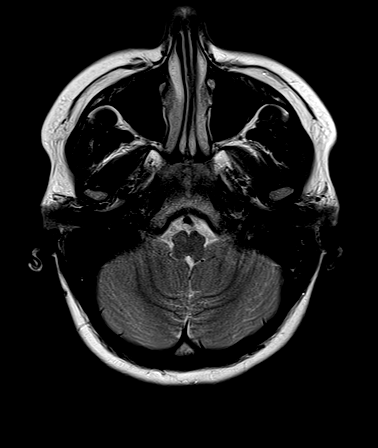
[im 10/25]
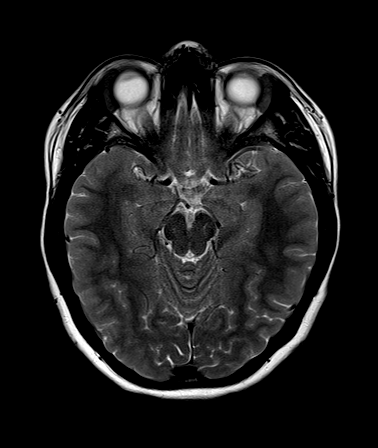
[im 15/25]
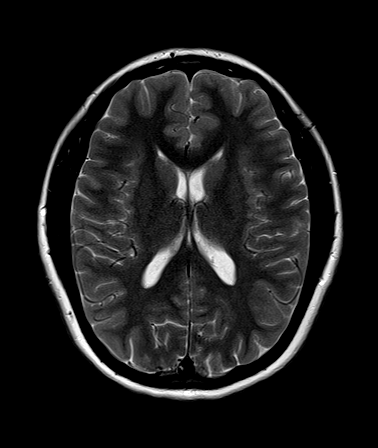
[im 20/25]
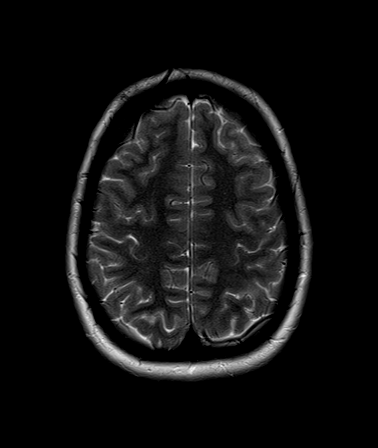
[im 25/25]
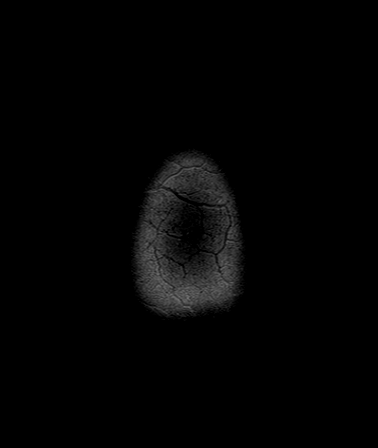

[T1 · axial · 5.0mm · 0.72mm/px · z∈[-65,+78]mm · 6 of 25 slices shown (2 of 2)]
[im 1/25]
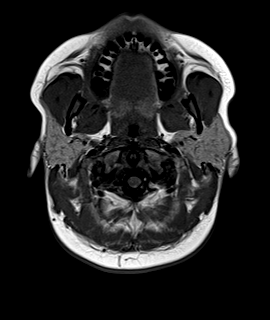
[im 5/25]
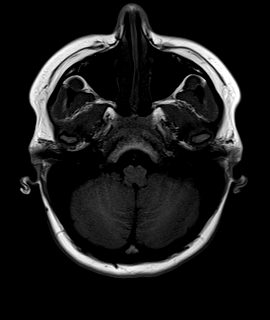
[im 10/25]
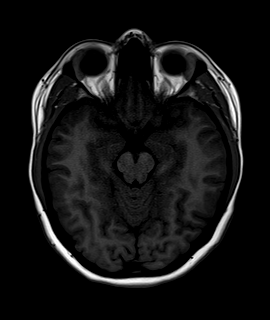
[im 15/25]
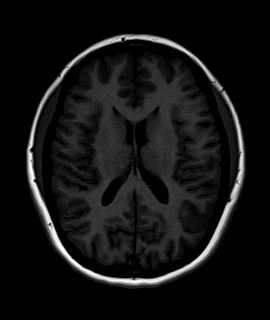
[im 20/25]
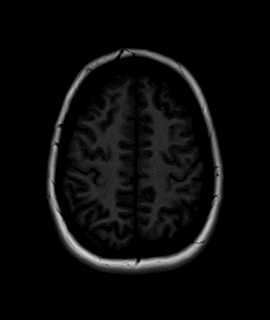
[im 25/25]
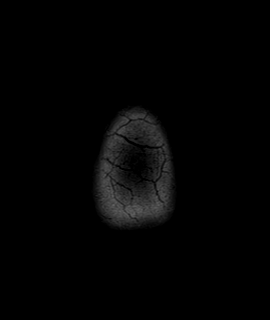

[FLAIR · axial · 5.0mm · 0.72mm/px · z∈[-65,+78]mm · 6 of 25 slices shown]
[im 1/25]
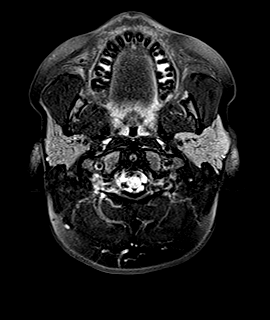
[im 5/25]
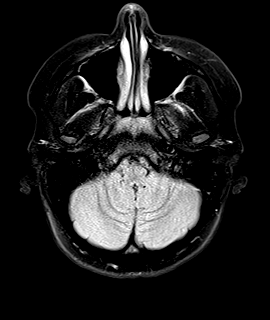
[im 10/25]
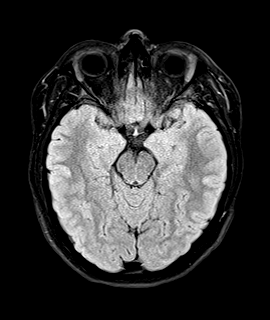
[im 15/25]
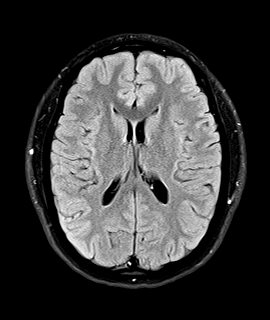
[im 20/25]
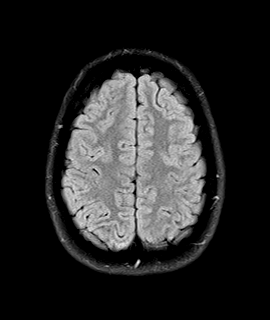
[im 25/25]
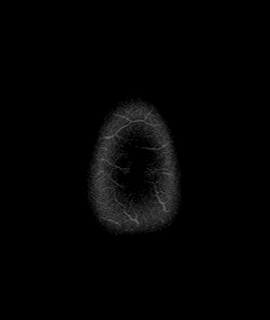

[GRE · axial · 5.0mm · 0.45mm/px · z∈[-65,+78]mm · 6 of 25 slices shown]
[im 1/25]
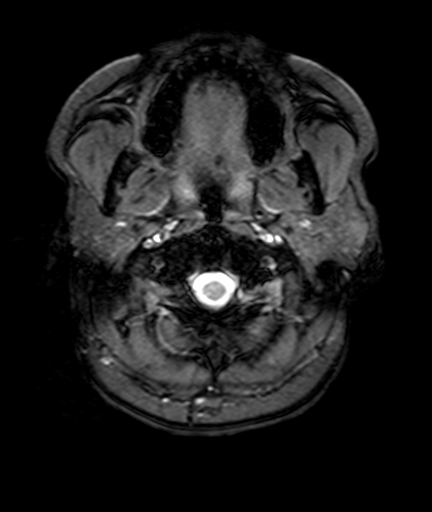
[im 5/25]
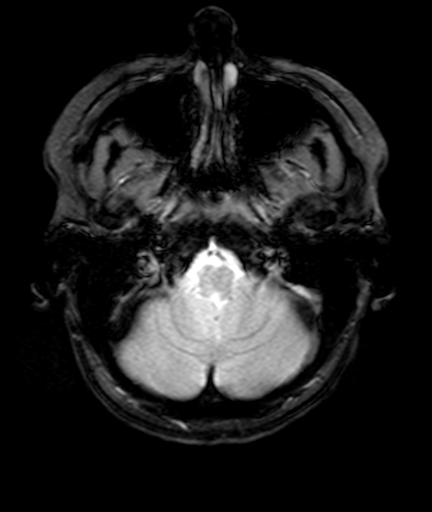
[im 10/25]
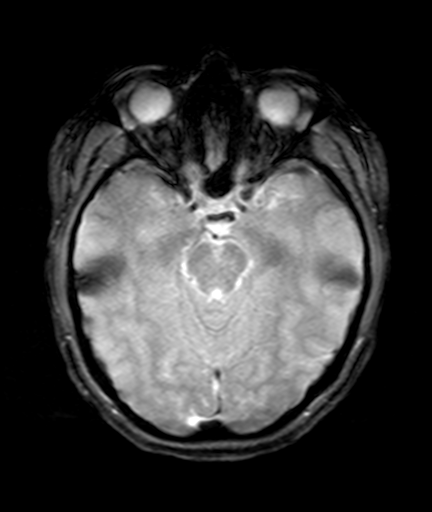
[im 15/25]
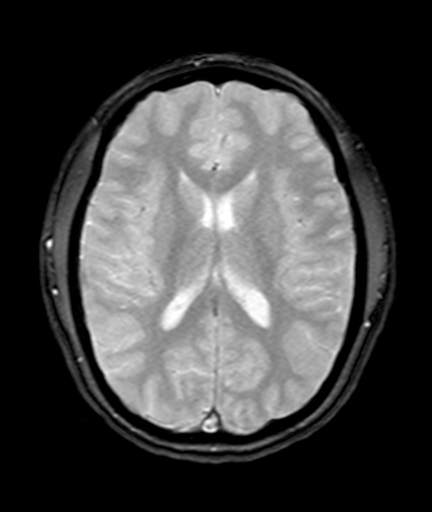
[im 20/25]
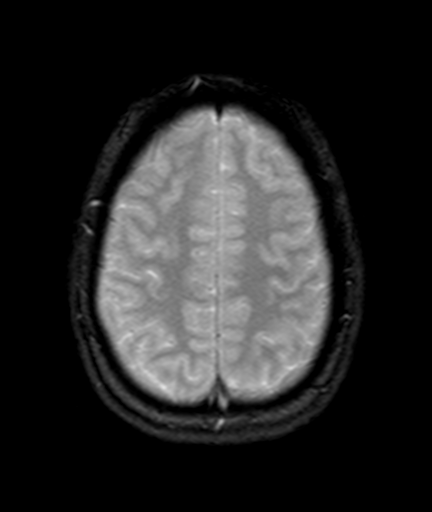
[im 25/25]
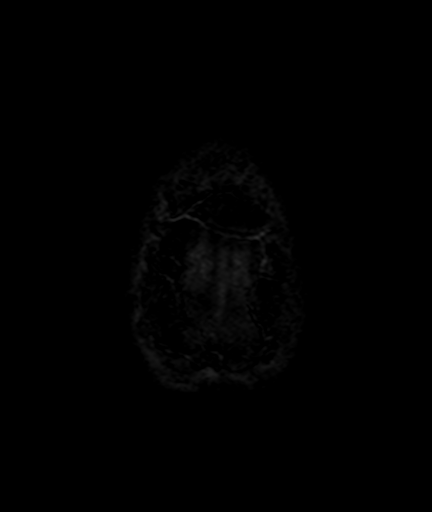

[32 of 48 positions shown; findings below may reference images not displayed]

FINDINGS: Motion-degraded study
No evidence for acute infarct, intracranial hemorrhage or mass lesion.  No extra-axial fluid collections.
No midline shift. Basal cisterns are patent.  Ventricles are normal and symmetric.
Intracranial vascular flow voids are normal.
Orbital contents are unremarkable.
Sinuses and mastoids are clear.
IMPRESSION: No acute intracranial abnormality
Is the patient pregnant?
No

## 2023-06-30 IMAGING — CT CT ABDOMEN PELVIS WITH CONTRAST
2 of 4 series · 17 of 46 positions shown, 19 images · non-contrast
Comparison: None provided.

Right side flank pain
FINAL REPORT:
EXAM:
CT Abdomen and Pelvis Without IV contrast
CLINICAL HISTORY: Right-sided flank pain.
TECHNIQUE: Axial computed tomography images of the abdomen and pelvis without intravenous contrast.
All CT scans at this facility use dose modulation, iterative reconstruction, and/or weight-based dosing when appropriate to reduce radiation dose to as low as reasonably achievable. "

[Series 3: thins · axial · 0.70mm/px · z∈[-458,-24]mm · 14 of 756 slices shown, 16 images]
[im 31/756  soft-tissue]
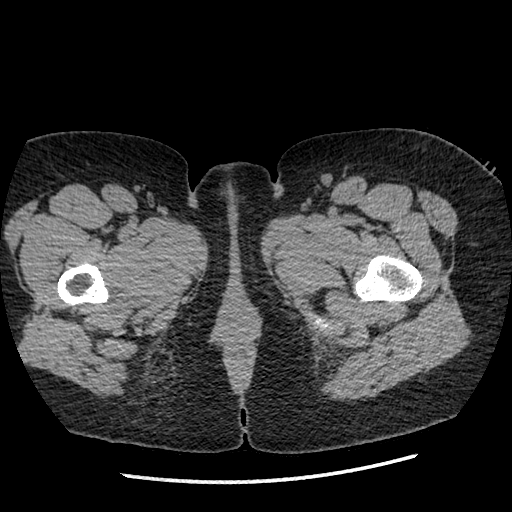
[im 31/756  bone]
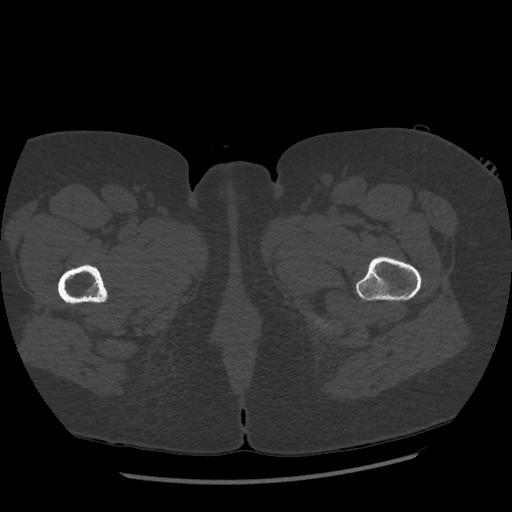
[im 91/756  soft-tissue]
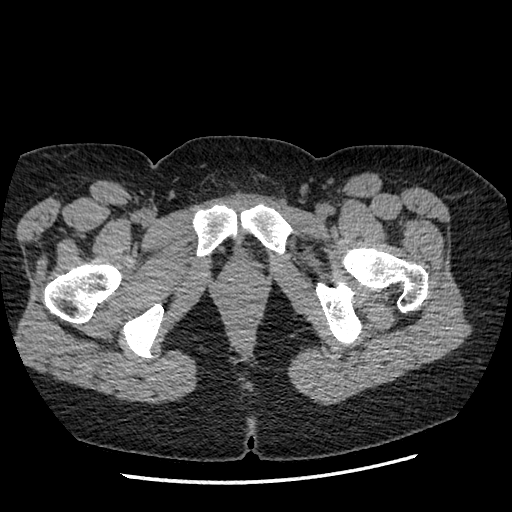
[im 152/756  soft-tissue]
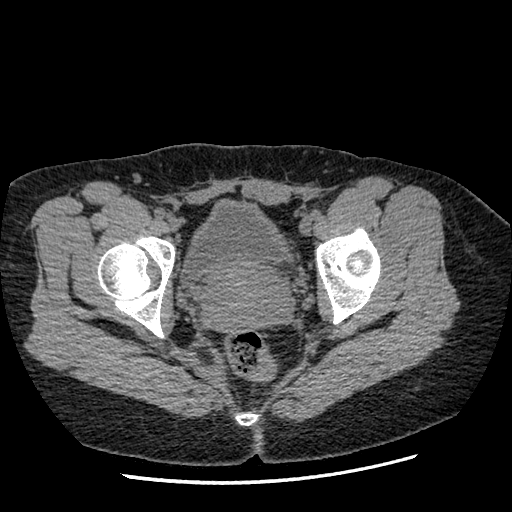
[im 212/756  soft-tissue]
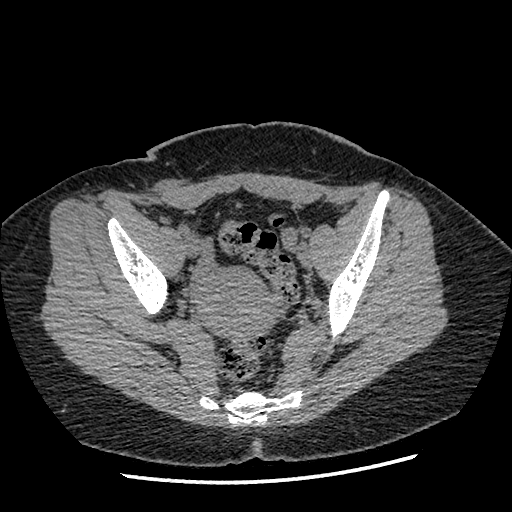
[im 242/756  soft-tissue]
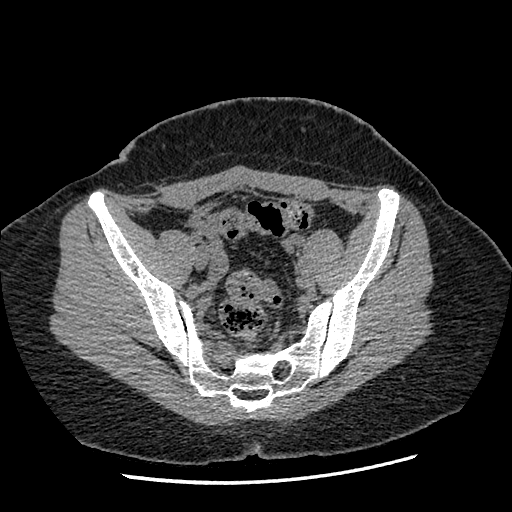
[im 303/756  soft-tissue]
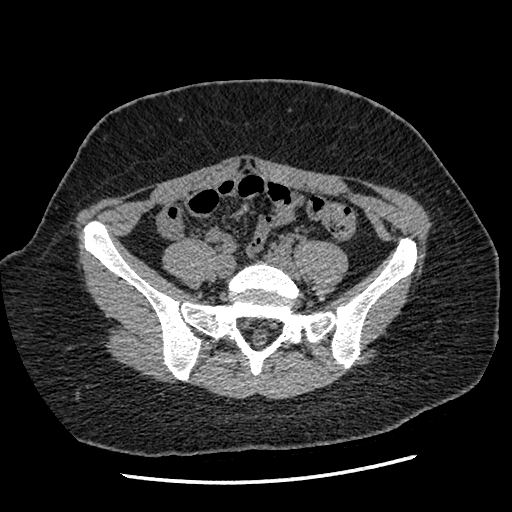
[im 363/756  soft-tissue]
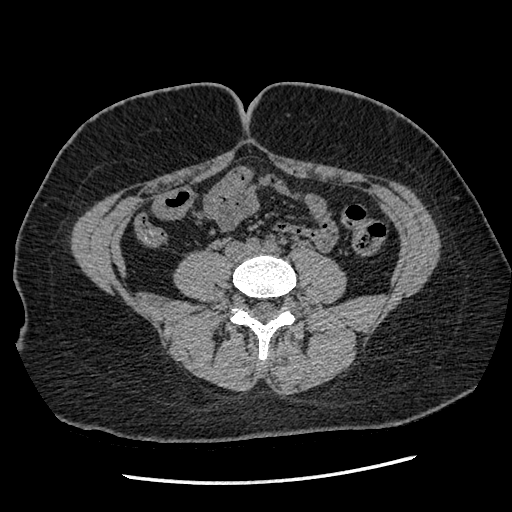
[im 393/756  soft-tissue]
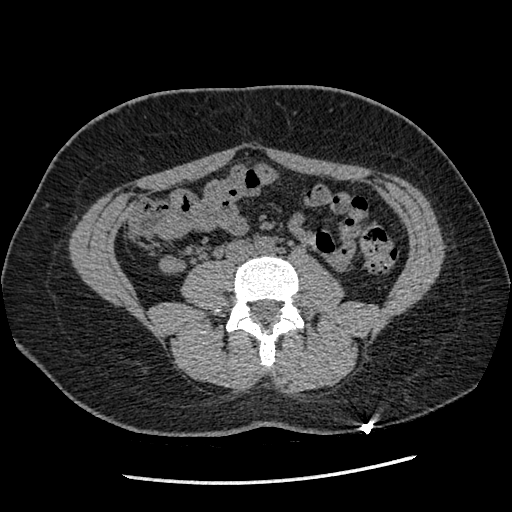
[im 454/756  soft-tissue]
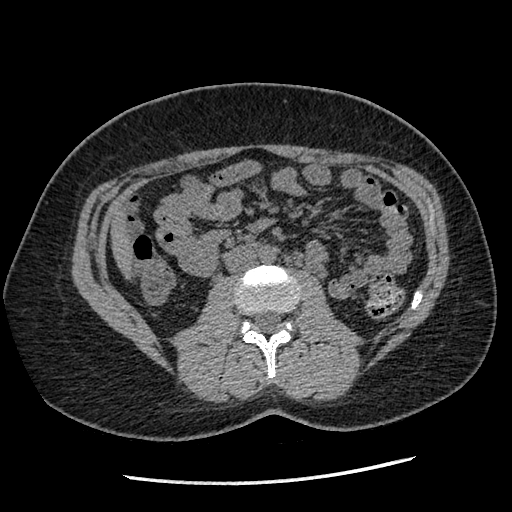
[im 454/756  bone]
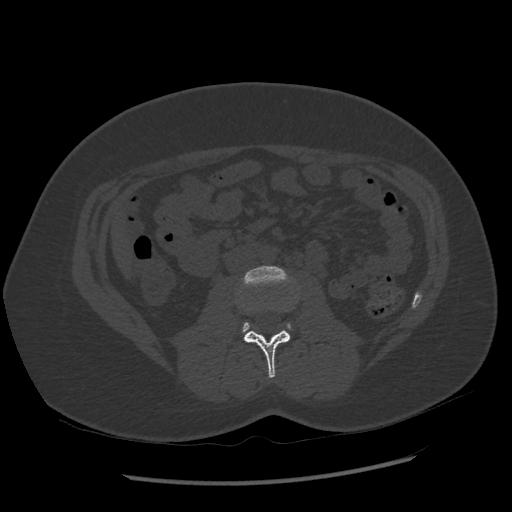
[im 514/756  soft-tissue]
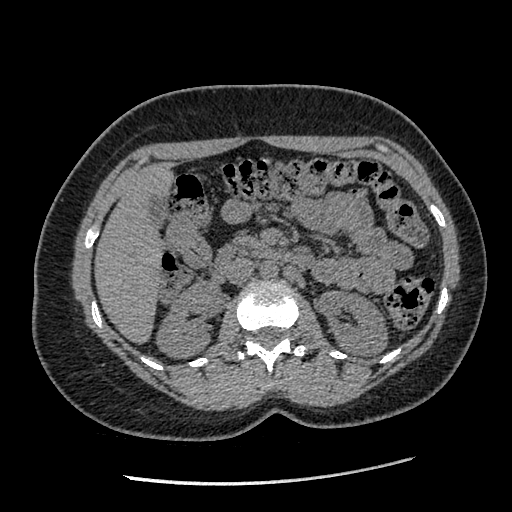
[im 574/756  soft-tissue]
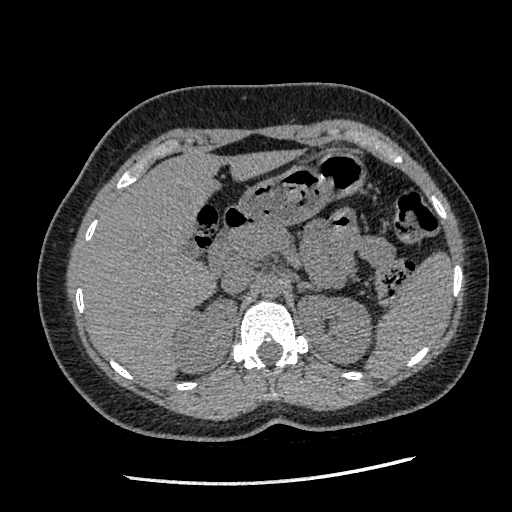
[im 605/756  soft-tissue]
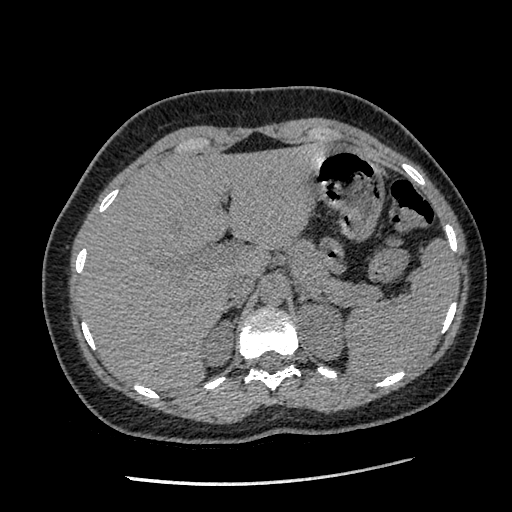
[im 665/756  soft-tissue]
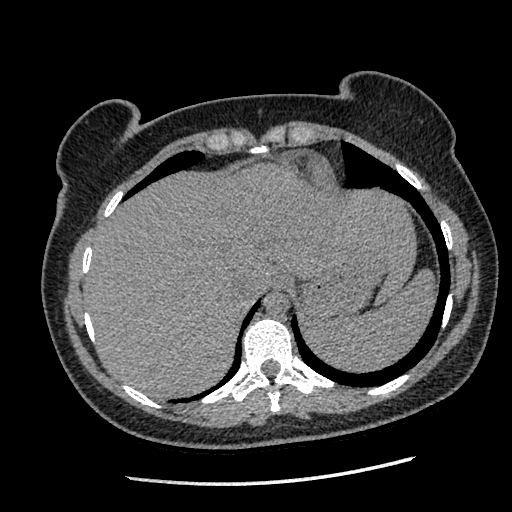
[im 725/756  soft-tissue]
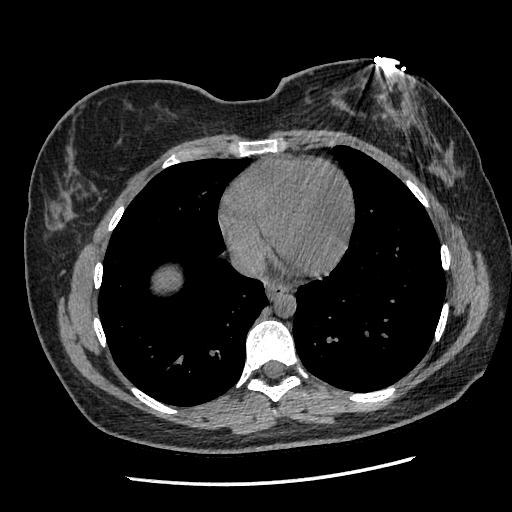

[Series 602: sag standard 2x2 · sagittal · 0.92mm/px · 3 of 181 slices shown]
[im 61/181  soft-tissue]
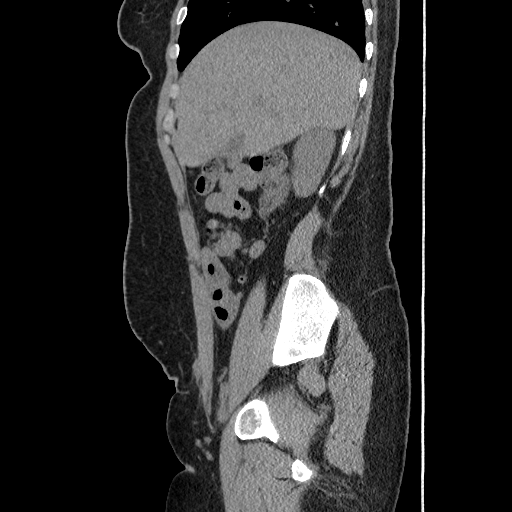
[im 81/181  soft-tissue]
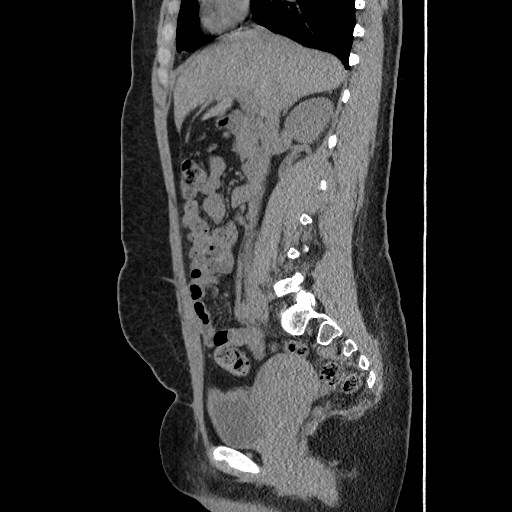
[im 101/181  soft-tissue]
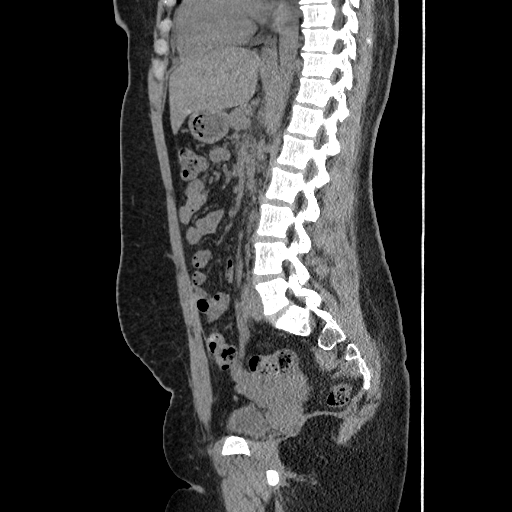

[17 of 46 positions shown; findings below may reference images not displayed]

FINDINGS: LUNG BASES:
The lung bases appear clear. No pleural effusions are seen.
LIVER:
No focal masses.
GALLBLADDER AND BILE DUCTS:
The gallbladder appears within normal limits. No radioopaque gallstones are seen. No biliary ductal dilatation is evident.
PANCREAS:
Unremarkable.
SPLEEN:
Unremarkable.
ADRENAL GLANDS:
Unremarkable.
KIDNEYS, URETERS, AND BLADDER:
Punctate nonobstructive right renal stone. No obstructive stone or hydronephrosis.
There is no bladder wall thickening.
STOMACH AND BOWEL:
Unremarkable appearance of the stomach and bowel. No evidence of bowel obstruction. No evidence suggesting enteritis or colitis.
APPENDIX:
Normal appendix.
PERITONEUM:
No free fluid. No free air.
LYMPH NODES:
No lymphadenopathy is evident.
REPRODUCTIVE:
No pelvic masses
VASCULATURE:
No evidence of abdominal aortic aneurysm.
BONES:
Chronic pars defects at L5 without significant listhesis.
ABDOMINAL WALL:
Unremarkable.
IMPRESSION: 1. Punctate nonobstructive right renal stone. No obstructive stone or hydronephrosis.
2. No acute inflammation of the abdomen or pelvis. Normal appendix.
Is the patient pregnant?
No
